# Patient Record
Sex: Male | Born: 1985 | Race: Black or African American | Hispanic: No | Marital: Single | Smoking: Former smoker
Health system: Southern US, Community
[De-identification: ages and names within clinical notes are randomized; demographics above are authoritative.]

## PROBLEM LIST (undated history)

## (undated) DIAGNOSIS — J45909 Unspecified asthma, uncomplicated: Secondary | ICD-10-CM

## (undated) DIAGNOSIS — E119 Type 2 diabetes mellitus without complications: Secondary | ICD-10-CM

## (undated) DIAGNOSIS — R339 Retention of urine, unspecified: Secondary | ICD-10-CM

## (undated) DIAGNOSIS — R310 Gross hematuria: Secondary | ICD-10-CM

## (undated) DIAGNOSIS — I209 Angina pectoris, unspecified: Secondary | ICD-10-CM

## (undated) DIAGNOSIS — J40 Bronchitis, not specified as acute or chronic: Secondary | ICD-10-CM

## (undated) DIAGNOSIS — F909 Attention-deficit hyperactivity disorder, unspecified type: Secondary | ICD-10-CM

## (undated) DIAGNOSIS — Z978 Presence of other specified devices: Secondary | ICD-10-CM

## (undated) DIAGNOSIS — I10 Essential (primary) hypertension: Secondary | ICD-10-CM

## (undated) DIAGNOSIS — Z960 Presence of urogenital implants: Secondary | ICD-10-CM

## (undated) HISTORY — PX: NO PAST SURGERIES: SHX2092

---
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
## 2012-03-03 ENCOUNTER — Encounter (HOSPITAL_BASED_OUTPATIENT_CLINIC_OR_DEPARTMENT_OTHER): Payer: Self-pay | Admitting: *Deleted

## 2012-03-03 ENCOUNTER — Emergency Department (HOSPITAL_BASED_OUTPATIENT_CLINIC_OR_DEPARTMENT_OTHER)
Admission: EM | Admit: 2012-03-03 | Discharge: 2012-03-04 | Disposition: A | Discharge status: Home / Self Care | Payer: Self-pay | Attending: Emergency Medicine | Admitting: Emergency Medicine

## 2012-03-03 DIAGNOSIS — F17200 Nicotine dependence, unspecified, uncomplicated: Secondary | ICD-10-CM | POA: Insufficient documentation

## 2012-03-03 DIAGNOSIS — R112 Nausea with vomiting, unspecified: Secondary | ICD-10-CM | POA: Insufficient documentation

## 2012-03-03 DIAGNOSIS — R197 Diarrhea, unspecified: Secondary | ICD-10-CM | POA: Insufficient documentation

## 2012-03-03 MED ORDER — ONDANSETRON 8 MG PO TBDP
ORAL_TABLET | ORAL | Status: AC
  Administered 2012-03-03: 8 mg via ORAL
  Filled 2012-03-03: qty 1

## 2012-03-03 MED ORDER — ONDANSETRON 8 MG PO TBDP
8.0000 mg | ORAL_TABLET | Freq: Once | ORAL | Status: AC
  Administered 2012-03-03: 8 mg via ORAL

## 2012-03-03 NOTE — ED Notes (Signed)
Pt c/o N/V/D, HA and body aches since this am.

## 2012-03-03 NOTE — ED Provider Notes (Signed)
History     CSN: [id number]  Arrival date & time 03/03/12  2011   First MD Initiated Contact with Patient 03/03/12 2301      Chief Complaint  Patient presents with  . Emesis    (Consider location/radiation/quality/duration/timing/severity/associated sxs/prior treatment) Patient is a 27 y.o. male presenting with vomiting and diarrhea. The history is provided by the patient. No language interpreter was used.  Emesis  This is a new problem. The current episode started 12 to 24 hours ago. The problem occurs 2 to 4 times per day. The problem has not changed since onset.The emesis has an appearance of stomach contents. There has been no fever. Associated symptoms include diarrhea. Pertinent negatives include no abdominal pain, no chills and no fever. Risk factors include ill contacts.  Diarrhea The primary symptoms include nausea, vomiting and diarrhea. Primary symptoms do not include fever or abdominal pain. The illness began today. The onset was sudden. The problem has not changed since onset. Nausea began today.  The vomiting began today.  The illness does not include chills or constipation. Associated medical issues do not include inflammatory bowel disease.    History reviewed. No pertinent past medical history.  History reviewed. No pertinent past surgical history.  No family history on file.  History  Substance Use Topics  . Smoking status: Current Every Day Smoker  . Smokeless tobacco: Not on file  . Alcohol Use: No      Review of Systems  Constitutional: Negative for fever and chills.  Gastrointestinal: Positive for nausea, vomiting and diarrhea. Negative for abdominal pain and constipation.  All other systems reviewed and are negative.    Allergies  Review of patient's allergies indicates no known allergies.  Home Medications  No current outpatient prescriptions on file.  BP 129/74  Pulse 88  Temp 99.6 F (37.6 C) (Oral)  Resp 18  Wt 205 lb (92.987 kg)   SpO2 98%  Physical Exam  Constitutional: He is oriented to person, place, and time. He appears well-developed and well-nourished. No distress.  HENT:  Head: Normocephalic and atraumatic.  Mouth/Throat: Oropharynx is clear and moist.  Eyes: Conjunctivae normal are normal. Pupils are equal, round, and reactive to light.  Neck: Normal range of motion. Neck supple.  Cardiovascular: Normal rate, regular rhythm and intact distal pulses.   Pulmonary/Chest: Effort normal and breath sounds normal. He has no wheezes. He has no rales.  Abdominal: Soft. Bowel sounds are normal. He exhibits no mass. There is no tenderness. There is no rebound and no guarding.  Musculoskeletal: Normal range of motion.  Neurological: He is alert and oriented to person, place, and time.  Skin: Skin is warm and dry.  Psychiatric: He has a normal mood and affect.    ED Course  Procedures (including critical care time)  Labs Reviewed - No data to display No results found.   No diagnosis found.    MDM  No further emesis or diarrhea.  Patient exam, vitals and skin turgor reassuring.  No indication for labs or imaging at this time.  Will prescribe zofran        Tavone Caesar K Habib Kise-Rasch, MD 03/04/12 [phone]

## 2012-03-04 MED ORDER — ONDANSETRON 8 MG PO TBDP: ORAL_TABLET | ORAL | Status: DC

## 2014-11-06 ENCOUNTER — Encounter (HOSPITAL_BASED_OUTPATIENT_CLINIC_OR_DEPARTMENT_OTHER): Payer: Self-pay | Admitting: Emergency Medicine

## 2014-11-06 ENCOUNTER — Emergency Department (HOSPITAL_BASED_OUTPATIENT_CLINIC_OR_DEPARTMENT_OTHER)
Admission: EM | Admit: 2014-11-06 | Discharge: 2014-11-06 | Disposition: A | Discharge status: Home / Self Care | Payer: BLUE CROSS/BLUE SHIELD | Attending: Emergency Medicine | Admitting: Emergency Medicine

## 2014-11-06 ENCOUNTER — Emergency Department (HOSPITAL_BASED_OUTPATIENT_CLINIC_OR_DEPARTMENT_OTHER): Payer: BLUE CROSS/BLUE SHIELD

## 2014-11-06 DIAGNOSIS — J209 Acute bronchitis, unspecified: Secondary | ICD-10-CM | POA: Insufficient documentation

## 2014-11-06 DIAGNOSIS — Z720 Tobacco use: Secondary | ICD-10-CM | POA: Diagnosis not present

## 2014-11-06 DIAGNOSIS — R05 Cough: Secondary | ICD-10-CM | POA: Diagnosis present

## 2014-11-06 MED ORDER — ALBUTEROL SULFATE HFA 108 (90 BASE) MCG/ACT IN AERS
2.0000 | INHALATION_SPRAY | Freq: Once | RESPIRATORY_TRACT | Status: AC
  Administered 2014-11-06: 2 via RESPIRATORY_TRACT
  Filled 2014-11-06: qty 6.7

## 2014-11-06 MED ORDER — AZITHROMYCIN 250 MG PO TABS: ORAL_TABLET | ORAL | Status: DC

## 2014-11-06 MED ORDER — HYDROCOD POLST-CPM POLST ER 10-8 MG/5ML PO SUER
5.0000 mL | Freq: Once | ORAL | Status: AC
  Administered 2014-11-06: 5 mL via ORAL
  Filled 2014-11-06: qty 5

## 2014-11-06 MED ORDER — DEXAMETHASONE 4 MG PO TABS
10.0000 mg | ORAL_TABLET | Freq: Once | ORAL | Status: AC
  Administered 2014-11-06: 10 mg via ORAL

## 2014-11-06 MED ORDER — DEXAMETHASONE 4 MG PO TABS
ORAL_TABLET | ORAL | Status: AC
  Filled 2014-11-06: qty 3

## 2014-11-06 MED ORDER — HYDROCOD POLST-CPM POLST ER 10-8 MG/5ML PO SUER: 5.0000 mL | Freq: Two times a day (BID) | ORAL | Status: DC | PRN

## 2014-11-06 NOTE — ED Provider Notes (Signed)
CSN: [id number]     Arrival date & time 11/06/14  2031 History  This chart was scribed for Paula Libra, MD by Octavia Heir, ED Scribe. This patient was seen in room MH12/MH12 and the patient's care was started at 11:31 PM.    Chief Complaint  Patient presents with  . Cough      The history is provided by the patient. No language interpreter was used.   HPI Comments: Caleb Sharp is a 29 y.o. male who presents to the Emergency Department complaining of a persistent, worsening cough onset 6 days ago. Pt has been having associated wheezing, chest congestion and a "squeezing" sensation in his chest when he coughs. His cough has been severe enough today to cause posttussive emesis. Pt received a nebulizer treatment when he came to the ED to help alleviate cough with no significant relief in the cough. Pt denies diarrhea and fever.  History reviewed. No pertinent past medical history. History reviewed. No pertinent past surgical history. History reviewed. No pertinent family history. Social History  Substance Use Topics  . Smoking status: Current Every Day Smoker  . Smokeless tobacco: None  . Alcohol Use: No    Review of Systems  A complete 10 system review of systems was obtained and all systems are negative except as noted in the HPI and PMH.    Allergies  Review of patient's allergies indicates no known allergies.  Home Medications   Prior to Admission medications   Medication Sig Start Date End Date Taking? Authorizing Provider  azithromycin (ZITHROMAX Z-PAK) 250 MG tablet 2 po day one, then 1 daily x 4 days 11/06/14   Paula Libra, MD  chlorpheniramine-HYDROcodone [facility] ER) 10-8 MG/5ML SUER Take 5 mLs by mouth every 12 (twelve) hours as needed for cough. 11/06/14   Paula Libra, MD  ondansetron (ZOFRAN ODT) 8 MG disintegrating tablet  ODT q8 hours prn nausea 03/04/12   April Palumbo, MD   Triage vitals: BP 126/88 mmHg  Pulse 111  Temp(Src) 98.9 F (37.2 C)  (Oral)  Resp 22  Ht  (1.753 m)  Wt 215 lb (97.523 kg)  BMI 31.74 kg/m2  SpO2 100%  Physical Exam  General: Well-developed, well-nourished male in no acute distress; appearance consistent with age of record HENT: normocephalic; atraumatic; pharynx normal; no dysphonia Eyes: pupils equal, round and reactive to light; extraocular muscles intact Neck: supple Heart: regular rate and rhythm Lungs: clear to auscultation bilaterally; frequent cough Abdomen: soft; nondistended; nontender; bowel sounds present Extremities: No deformity; full range of motion Neurologic: Awake, alert and oriented; motor function intact in all extremities and symmetric; no facial droop Skin: Warm and dry Psychiatric: Normal mood and affect   ED Course  Procedures  DIAGNOSTIC STUDIES: Oxygen Saturation is 100% on RA, normal by my interpretation.  COORDINATION OF CARE:  11:36 PM Discussed treatment plan with pt at bedside and pt agreed to plan.   EKG Interpretation   Date/Time:  Tuesday November 06 2014 20:51:41 EDT Ventricular Rate:  114 PR Interval:  152 QRS Duration: 100 QT Interval:  332 QTC Calculation: 457 R Axis:   137 Text Interpretation:  Sinus tachycardia Right axis deviation ST \\T \ T wave  abnormality, consider inferior ischemia Abnormal ECG Nonspecific ST  abnormality Confirmed by Manus Gunning  MD, STEPHEN [phone]) on 11/06/2014 8:53:30  PM      MDM  Nursing notes and vitals signs, including pulse oximetry, reviewed.  Summary of this visit's results, reviewed by myself:  Imaging  Studies: Dg Chest 2 View  11/06/2014   CLINICAL DATA:  Cough, mid chest pain.  EXAM: CHEST  2 VIEW  COMPARISON:  None.  FINDINGS: The heart size and mediastinal contours are within normal limits. Both lungs are clear. The visualized skeletal structures are unremarkable.  IMPRESSION: No active cardiopulmonary disease.   Electronically Signed   By: Esperanza Heir M.D.   On: 11/06/2014 23:22     Final  diagnoses:  Acute bronchitis with bronchospasm   I personally performed the services described in this documentation, which was scribed in my presence. The recorded information has been reviewed and is accurate.   Paula Libra, MD 11/06/14 [phone]

## 2014-11-06 NOTE — Discharge Instructions (Signed)

## 2014-11-06 NOTE — ED Notes (Signed)
Patient states that he is cough with chest burning. Reports that he is also vomiting after he coughs

## 2014-11-06 NOTE — ED Notes (Signed)
Pt returned from xray

## 2014-11-06 NOTE — ED Notes (Signed)
Pt c/o cough with yellow phlegm since Thursday.  Pt denies fever, hx of asthma and bronchitis.

## 2014-11-06 NOTE — ED Notes (Signed)
Patient transported to X-ray 

## 2014-11-06 NOTE — ED Notes (Signed)
MD at bedside. 

## 2015-06-08 ENCOUNTER — Emergency Department (HOSPITAL_BASED_OUTPATIENT_CLINIC_OR_DEPARTMENT_OTHER)
Admission: EM | Admit: 2015-06-08 | Discharge: 2015-06-08 | Disposition: A | Discharge status: Home / Self Care | Payer: BLUE CROSS/BLUE SHIELD | Attending: Emergency Medicine | Admitting: Emergency Medicine

## 2015-06-08 ENCOUNTER — Encounter (HOSPITAL_BASED_OUTPATIENT_CLINIC_OR_DEPARTMENT_OTHER): Payer: Self-pay | Admitting: Emergency Medicine

## 2015-06-08 DIAGNOSIS — Z7984 Long term (current) use of oral hypoglycemic drugs: Secondary | ICD-10-CM | POA: Diagnosis not present

## 2015-06-08 DIAGNOSIS — E119 Type 2 diabetes mellitus without complications: Secondary | ICD-10-CM | POA: Diagnosis not present

## 2015-06-08 DIAGNOSIS — Z87891 Personal history of nicotine dependence: Secondary | ICD-10-CM | POA: Diagnosis not present

## 2015-06-08 DIAGNOSIS — R339 Retention of urine, unspecified: Secondary | ICD-10-CM | POA: Diagnosis not present

## 2015-06-08 DIAGNOSIS — R300 Dysuria: Secondary | ICD-10-CM | POA: Diagnosis present

## 2015-06-08 LAB — URINE MICROSCOPIC-ADD ON

## 2015-06-08 LAB — URINALYSIS, ROUTINE W REFLEX MICROSCOPIC
Bilirubin Urine: NEGATIVE
Glucose, UA: >1000 mg/dL — AB
Ketones, ur: NEGATIVE mg/dL
Leukocytes, UA: NEGATIVE
Nitrite: NEGATIVE
Protein, ur: 30 mg/dL — AB
Specific Gravity, Urine: 1.026 (ref 1.005–1.030)
pH: 5.5 (ref 5.0–8.0)

## 2015-06-08 MED ORDER — TAMSULOSIN HCL 0.4 MG PO CAPS: 0.4000 mg | ORAL_CAPSULE | Freq: Every day | ORAL | Status: DC

## 2015-06-08 NOTE — Discharge Instructions (Signed)
Acute Urinary Retention, Male °Acute urinary retention is the temporary inability to urinate. °This is a common problem in older men. As men age their prostates become larger and block the flow of urine from the bladder. This is usually a problem that has come on gradually.  °HOME CARE INSTRUCTIONS °If you are sent home with a Foley catheter and a drainage system, you will need to discuss the best course of action with your health care provider. While the catheter is in, maintain a good intake of fluids. Keep the drainage bag emptied and lower than your catheter. This is so that contaminated urine will not flow back into your bladder, which could lead to a urinary tract infection. °There are two main types of drainage bags. One is a large bag that usually is used at night. It has a good capacity that will allow you to sleep through the night without having to empty it. The second type is called a leg bag. It has a smaller capacity, so it needs to be emptied more frequently. However, the main advantage is that it can be attached by a leg strap and can go underneath your clothing, allowing you the freedom to move about or leave your home. °Only take over-the-counter or prescription medicines for pain, discomfort, or fever as directed by your health care provider.  °SEEK MEDICAL CARE IF: °· You develop a low-grade fever. °· You experience spasms or leakage of urine with the spasms. °SEEK IMMEDIATE MEDICAL CARE IF:  °· You develop chills or fever. °· Your catheter stops draining urine. °· Your catheter falls out. °· You start to develop increased bleeding that does not respond to rest and increased fluid intake. °MAKE SURE YOU: °· Understand these instructions. °· Will watch your condition. °· Will get help right away if you are not doing well or get worse. °  °This information is not intended to replace advice given to you by your health care provider. Make sure you discuss any questions you have with your health care  provider. °  °Document Released: 05/25/2000 Document Revised: 07/03/2014 Document Reviewed: 07/28/2012 °Elsevier Interactive Patient Education ©2016 [employer] ° °

## 2015-06-08 NOTE — ED Notes (Signed)
Gave and Applied leg bag to patient's left leg.

## 2015-06-08 NOTE — ED Provider Notes (Addendum)
CSN: [id number]     Arrival date & time 06/08/15  6962 History   First MD Initiated Contact with Patient 06/08/15 0654     Chief Complaint  Patient presents with  . Dysuria     (Consider location/radiation/quality/duration/timing/severity/associated sxs/prior Treatment) HPI Comments: Patient history of diabetes presents with inability to urinate. He states he was able to urinate small amounts yesterday and his last normal urine was about 6:00 last night. He hasn't had any good urine output at all through the night. He has some pressure to his lower abdomen and is very uncomfortable. He denies any nausea or vomiting. No back pain. No burning on urination. He states in the past he's had difficulty urinating from time to time but generally self resolved over 24 hours. He also has had past urinary tract infections. He has not ever seen a urologist. He denies any constipation or pain with bowel movements. He denies any penile pain or discharge. He denies any known fevers.  Patient is a 30 y.o. male presenting with dysuria.  Dysuria Pertinent negatives include no chest pain, no abdominal pain, no headaches and no shortness of breath.    Past Medical History  Diagnosis Date  . Diabetes mellitus without complication (HCC)    History reviewed. No pertinent past surgical history. No family history on file. Social History  Substance Use Topics  . Smoking status: Former Smoker    Quit date: 12/31/2014  . Smokeless tobacco: None  . Alcohol Use: No    Review of Systems  Constitutional: Negative for fever, chills, diaphoresis and fatigue.  HENT: Negative for congestion, rhinorrhea and sneezing.   Eyes: Negative.   Respiratory: Negative for cough, chest tightness and shortness of breath.   Cardiovascular: Negative for chest pain and leg swelling.  Gastrointestinal: Negative for nausea, vomiting, abdominal pain, diarrhea and blood in stool.  Genitourinary: Positive for dysuria. Negative for  frequency, hematuria, flank pain and difficulty urinating.  Musculoskeletal: Negative for back pain and arthralgias.  Skin: Negative for rash.  Neurological: Negative for dizziness, speech difficulty, weakness, numbness and headaches.      Allergies  Review of patient's allergies indicates no known allergies.  Home Medications   Prior to Admission medications   Medication Sig Start Date End Date Taking? Authorizing Provider  metFORMIN (GLUCOPHAGE) 1000 MG tablet Take 1,000 mg by mouth 2 (two) times daily with a meal.   Yes Historical Provider, MD  azithromycin (ZITHROMAX Z-PAK) 250 MG tablet 2 po day one, then 1 daily x 4 days 11/06/14   Paula Libra, MD  chlorpheniramine-HYDROcodone [facility] PENNKINETIC ER) 10-8 MG/5ML SUER Take 5 mLs by mouth every 12 (twelve) hours as needed for cough. 11/06/14   John Molpus, MD  ondansetron (ZOFRAN ODT) 8 MG disintegrating tablet  ODT q8 hours prn nausea 03/04/12   April Palumbo, MD   BP 144/99 mmHg  Pulse 98  Temp(Src) 98.5 F (36.9 C) (Oral)  Resp 18  SpO2 99% Physical Exam  Constitutional: He is oriented to person, place, and time. He appears well-developed and well-nourished. He appears distressed (appears uncomfortable).  HENT:  Head: Normocephalic and atraumatic.  Eyes: Pupils are equal, round, and reactive to light.  Neck: Normal range of motion. Neck supple.  Cardiovascular: Normal rate, regular rhythm and normal heart sounds.   Pulmonary/Chest: Effort normal and breath sounds normal. No respiratory distress. He has no wheezes. He has no rales. He exhibits no tenderness.  Abdominal: Soft. Bowel sounds are normal. There is no tenderness. There  is no rebound and no guarding.  Genitourinary:  Normal circumcised penis. No testicular pain. No pain in the inguinal canals. No masses.  Musculoskeletal: Normal range of motion. He exhibits no edema.  Lymphadenopathy:    He has no cervical adenopathy.  Neurological: He is alert and oriented to  person, place, and time.  Skin: Skin is warm and dry. No rash noted.  Psychiatric: He has a normal mood and affect.    ED Course  Procedures (including critical care time) Labs Review Labs Reviewed  URINALYSIS, ROUTINE W REFLEX MICROSCOPIC (NOT [facility]) - Abnormal; Notable for the following:    Glucose, UA >1000 (*)    Hgb urine dipstick LARGE (*)    Protein, ur 30 (*)    All other components within normal limits  URINE MICROSCOPIC-ADD ON - Abnormal; Notable for the following:    Squamous Epithelial / LPF 0-5 (*)    Bacteria, UA FEW (*)    All other components within normal limits  URINE CULTURE    Imaging Review No results found. I have personally reviewed and evaluated these images and lab results as part of my medical decision-making.   EKG Interpretation None      MDM   Final diagnoses:  Urinary retention    Patient has had over 500 cc of yellow urine return after Foley catheter was placed in the ED. Given this, the catheter was left in place with a leg bag. There is no evidence of a UTI. He does have blood but the nurse to place the Foley states that it was a bit traumatic and feels like the blood is likely related to that. He doesn't have any symptoms that would be more consistent with renal colic. He hasn't had any recent pain to his back or abdomen that sounds suspicious for kidney stones. He was discharged home in good condition. He was advised that it's very important to make an APPOINTMENT with urology for follow-up.  I also started him on flomax    Rolan BuccoMelanie Jacobus Colvin, MD 06/08/15 [id number]  Rolan BuccoMelanie Tinika Bucknam, MD 06/08/15 [phone]

## 2015-06-08 NOTE — ED Notes (Signed)
Pt states he has been unable to void since yesterday. Pt visibly uncomfortable. States some low back pain, denies fever. States he is able to trickle and sees some blood but no substantial stream. States no hx of this.

## 2015-06-10 LAB — URINE CULTURE: Culture: NO GROWTH

## 2015-06-12 ENCOUNTER — Other Ambulatory Visit: Payer: Self-pay | Admitting: Urology

## 2015-06-17 ENCOUNTER — Encounter (HOSPITAL_BASED_OUTPATIENT_CLINIC_OR_DEPARTMENT_OTHER): Payer: Self-pay | Admitting: *Deleted

## 2015-06-17 NOTE — Progress Notes (Signed)
NPO AFTER MN.  ARRIVE AT 1030.  NEEDS EKG.  GETTING LAB WORK DONE Tuesday 06-18-2015.  WILL TAKE RAPAFLO AM DOS W/ SIPS OF WATER.

## 2015-06-18 NOTE — H&P (Cosign Needed)
History of Present Illness   Caleb Sharp presents today as a referral from the emergency room for urinary retention. He is currently 30 years of age and without prior urologic history. He tells me several months ago he was diagnosed with diabetes mellitus and currently is on metformin. For months now, he has had intermittent gross hematuria, total and terminal in nature. For several weeks, he began noticing increased hesitancy with a significant weakening of his urinary stream. This culminated in urinary retention about 5 days ago. A urethral catheter was placed with 600 mL of clear urine obtained. They did not indicate any significant difficulty inserting the catheter. Urinalysis did show blood along with glucosuria. There was no evidence of cystitis or prostatitis. He had had no clinical symptoms suggestive of either of those conditions. He has had typical irritative symptoms from the catheter but otherwise has been doing okay. He has noticed some ongoing intermittent hematuria. Urine in the bag today looks relatively unremarkable. He denies any prior history of urethral instrumentation or obvious known traumas or infection. He was given a prescription for Flomax but has not picked up that medication at this time.     Past Medical History Problems  1. History of diabetes mellitus (Z86.39)  Surgical History Problems  1. History of No Surgical Problems  Current Meds 1. Flomax 0.4 MG Oral Capsule;  Therapy: (Recorded:12Apr2017) to Recorded 2. MetFORMIN HCl - 1000 MG Oral Tablet;  Therapy: (Recorded:12Apr2017) to Recorded  Allergies Medication  1. No Known Drug Allergies  Family History Problems  1. No pertinent family history : Mother, Father  Social History Problems    Alcohol use (Z78.9)   social use   Caffeine use (F15.90)   2 sodas daily   Former smoker [phone](Z87.891)   smoked for 4 years; quit approx. 1.5 years ago   Number of children   1 daughter   Occupation   [employer]  Review of Systems Genitourinary, constitutional, skin, eye, otolaryngeal, hematologic/lymphatic, cardiovascular, pulmonary, endocrine, musculoskeletal, gastrointestinal, neurological and psychiatric system(s) were reviewed and pertinent findings if present are noted and are otherwise negative.  Genitourinary: difficulty starting the urinary stream, weak urinary stream, urinary stream starts and stops, incomplete emptying of bladder and hematuria.  Gastrointestinal: no flank pain and no abdominal pain.  ENT: sore throat.    Vitals Vital Signs [Data Includes: Last 1 Day]  Recorded: 12Apr2017 10:31AM  Height: 5 ft 6 in Weight: 219 lb  BMI Calculated: 35.35 BSA Calculated: 2.08 Blood Pressure: 134 / 92 Temperature: 98.3 F Heart Rate: 104  Physical Exam Constitutional: Well nourished and well developed . No acute distress.  ENT:. The ears and nose are normal in appearance.  Neck: The appearance of the neck is normal and no neck mass is present.  Pulmonary: No respiratory distress and normal respiratory rhythm and effort.  Abdomen: The abdomen is soft and nontender. No masses are palpated. No CVA tenderness. No hernias are palpable. No hepatosplenomegaly noted.  Rectal: The prostate exam was deferred.  Genitourinary: Examination of the penis demonstrates an indwelling catheter, but no discharge, no masses, no lesions and a normal meatus. The penis is circumcised. The scrotum is without lesions. The right epididymis is palpably normal and non-tender. The left epididymis is palpably normal and non-tender. The right testis is non-tender and without masses. The left testis is non-tender and without masses.  Skin: Normal skin turgor, no visible rash and no visible skin lesions.  Neuro/Psych:. Mood and affect are appropriate.  Assessment Assessed  1. Hematuria, gross (R31.0) 2. Retention, urine (R33.9) 3. 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 AU  CT-HEMATURIA PROTOCOL; Status:Hold For - Appointment,PreCert,Date of  Service,Print; Requested for:12Apr2017;  2. Follow-up Schedule Surgery Office  Follow-up  Status: Hold For - Appointment   Requested for: 12Apr2017  Discussion/Summary   Caleb Sharp has had several months now of intermittent gross hematuria. More recently, he developed significant obstructive voiding symptoms, which progressed to the point that he developed urinary retention. He definitely requires additional assessment. I would not feel comfortable simply removing his catheter, especially given the fact he has not started on any alpha blocker therapy. Urethral stricture or dysfunctional voiding due to tightening of the bladder neck are certainly possibilities. We do need to rule out other less likely and more unusual etiologies. I am not sure how well he would tolerate an in-office cystoscopy, so I have suggested we do that under anesthesia. I would like him to try an alpha blocker, and we will get him on RAPAFLO with samples provided. We will order a hematuria protocol CT to be performed sometime in the next several days. We will plan on, hopefully next week, bringing him in for a cystoscopy with assessment. If there is evidence of any urethral stricture, we can dilate, and if anything needs to be biopsied or further evaluated, we will do that. We would plan on an outpatient procedure with removal of his catheter, and hopefully he will be able to void.  cc: Jackie Plum, MD   Signatures Electronically signed by : Barron Alvine, M.D.; Jun 14 2015  8:57AM EST

## 2015-06-19 ENCOUNTER — Encounter (HOSPITAL_BASED_OUTPATIENT_CLINIC_OR_DEPARTMENT_OTHER)
Admission: RE | Disposition: A | Discharge status: Home / Self Care | Payer: Self-pay | Source: Ambulatory Visit | Attending: Urology

## 2015-06-19 ENCOUNTER — Encounter (HOSPITAL_BASED_OUTPATIENT_CLINIC_OR_DEPARTMENT_OTHER): Payer: Self-pay | Admitting: Anesthesiology

## 2015-06-19 ENCOUNTER — Ambulatory Visit (HOSPITAL_BASED_OUTPATIENT_CLINIC_OR_DEPARTMENT_OTHER): Payer: BLUE CROSS/BLUE SHIELD | Admitting: Anesthesiology

## 2015-06-19 ENCOUNTER — Ambulatory Visit (HOSPITAL_BASED_OUTPATIENT_CLINIC_OR_DEPARTMENT_OTHER)
Admission: RE | Admit: 2015-06-19 | Discharge: 2015-06-19 | Disposition: A | Discharge status: Home / Self Care | Payer: BLUE CROSS/BLUE SHIELD | Source: Ambulatory Visit | Attending: Urology | Admitting: Urology

## 2015-06-19 DIAGNOSIS — E119 Type 2 diabetes mellitus without complications: Secondary | ICD-10-CM | POA: Insufficient documentation

## 2015-06-19 DIAGNOSIS — R310 Gross hematuria: Secondary | ICD-10-CM | POA: Diagnosis not present

## 2015-06-19 DIAGNOSIS — R339 Retention of urine, unspecified: Secondary | ICD-10-CM | POA: Diagnosis present

## 2015-06-19 DIAGNOSIS — Z87891 Personal history of nicotine dependence: Secondary | ICD-10-CM | POA: Diagnosis not present

## 2015-06-19 DIAGNOSIS — Z9114 Patient's other noncompliance with medication regimen: Secondary | ICD-10-CM | POA: Insufficient documentation

## 2015-06-19 DIAGNOSIS — R000 Tachycardia, unspecified: Secondary | ICD-10-CM | POA: Diagnosis not present

## 2015-06-19 HISTORY — DX: Retention of urine, unspecified: R33.9

## 2015-06-19 HISTORY — DX: Presence of other specified devices: Z97.8

## 2015-06-19 HISTORY — PX: CYSTOSCOPY WITH FULGERATION: SHX6638

## 2015-06-19 HISTORY — DX: Presence of urogenital implants: Z96.0

## 2015-06-19 HISTORY — DX: Type 2 diabetes mellitus without complications: E11.9

## 2015-06-19 HISTORY — DX: Gross hematuria: R31.0

## 2015-06-19 LAB — CBC
HCT: 46.9 % (ref 39.0–52.0)
Hemoglobin: 16.6 g/dL (ref 13.0–17.0)
MCH: 30.5 pg (ref 26.0–34.0)
MCHC: 35.4 g/dL (ref 30.0–36.0)
MCV: 86.1 fL (ref 78.0–100.0)
Platelets: 265 10*3/uL (ref 150–400)
RBC: 5.45 MIL/uL (ref 4.22–5.81)
RDW: 12.5 % (ref 11.5–15.5)
WBC: 7.4 10*3/uL (ref 4.0–10.5)

## 2015-06-19 LAB — BASIC METABOLIC PANEL
Anion gap: 10 (ref 5–15)
BUN: 13 mg/dL (ref 6–20)
CO2: 27 mmol/L (ref 22–32)
Calcium: 9.8 mg/dL (ref 8.9–10.3)
Chloride: 101 mmol/L (ref 101–111)
Creatinine, Ser: 0.89 mg/dL (ref 0.61–1.24)
GFR calc Af Amer: >60 mL/min (ref >60)
GFR calc non Af Amer: >60 mL/min (ref >60)
Glucose, Bld: 261 mg/dL — ABNORMAL HIGH (ref 65–99)
Potassium: 4.1 mmol/L (ref 3.5–5.1)
Sodium: 138 mmol/L (ref 135–145)

## 2015-06-19 LAB — GLUCOSE, CAPILLARY
Glucose-Capillary: 235 mg/dL — ABNORMAL HIGH (ref 65–99)
Glucose-Capillary: 231 mg/dL — ABNORMAL HIGH (ref 65–99)

## 2015-06-19 SURGERY — CYSTOSCOPY, WITH BLADDER FULGURATION
Anesthesia: General | Site: Bladder

## 2015-06-19 MED ORDER — CEFAZOLIN SODIUM-DEXTROSE 2-4 GM/100ML-% IV SOLN
2.0000 g | INTRAVENOUS | Status: AC
  Administered 2015-06-19: 2 g via INTRAVENOUS
  Filled 2015-06-19: qty 100

## 2015-06-19 MED ORDER — MIDAZOLAM HCL 2 MG/2ML IJ SOLN
INTRAMUSCULAR | Status: AC
  Filled 2015-06-19: qty 2

## 2015-06-19 MED ORDER — ONDANSETRON HCL 4 MG/2ML IJ SOLN
INTRAMUSCULAR | Status: DC | PRN
  Administered 2015-06-19: 4 mg via INTRAVENOUS

## 2015-06-19 MED ORDER — FENTANYL CITRATE (PF) 100 MCG/2ML IJ SOLN
INTRAMUSCULAR | Status: AC
  Filled 2015-06-19: qty 2

## 2015-06-19 MED ORDER — LACTATED RINGERS IV SOLN
INTRAVENOUS | Status: DC
  Administered 2015-06-19 (×2): via INTRAVENOUS
  Filled 2015-06-19: qty 1000

## 2015-06-19 MED ORDER — LIDOCAINE HCL (CARDIAC) 20 MG/ML IV SOLN
INTRAVENOUS | Status: AC
  Filled 2015-06-19: qty 5

## 2015-06-19 MED ORDER — CEFAZOLIN SODIUM-DEXTROSE 2-4 GM/100ML-% IV SOLN
INTRAVENOUS | Status: AC
  Filled 2015-06-19: qty 100

## 2015-06-19 MED ORDER — LIDOCAINE HCL 2 % EX GEL
CUTANEOUS | Status: DC | PRN
  Administered 2015-06-19: 1 via URETHRAL

## 2015-06-19 MED ORDER — FENTANYL CITRATE (PF) 100 MCG/2ML IJ SOLN
INTRAMUSCULAR | Status: DC | PRN
  Administered 2015-06-19 (×3): 25 ug via INTRAVENOUS
  Administered 2015-06-19: 50 ug via INTRAVENOUS

## 2015-06-19 MED ORDER — KETOROLAC TROMETHAMINE 30 MG/ML IJ SOLN
INTRAMUSCULAR | Status: DC | PRN
  Administered 2015-06-19: 30 mg via INTRAVENOUS

## 2015-06-19 MED ORDER — HYDROMORPHONE HCL 1 MG/ML IJ SOLN
0.2500 mg | INTRAMUSCULAR | Status: DC | PRN
  Filled 2015-06-19: qty 1

## 2015-06-19 MED ORDER — PROPOFOL 10 MG/ML IV BOLUS
INTRAVENOUS | Status: AC
Start: 2015-06-19 — End: 2015-06-19
  Filled 2015-06-19: qty 40

## 2015-06-19 MED ORDER — DEXAMETHASONE SODIUM PHOSPHATE 4 MG/ML IJ SOLN
INTRAMUSCULAR | Status: DC | PRN
  Administered 2015-06-19: 5 mg via INTRAVENOUS

## 2015-06-19 MED ORDER — PHENAZOPYRIDINE HCL 200 MG PO TABS: 200.0000 mg | ORAL_TABLET | Freq: Three times a day (TID) | ORAL | Status: DC | PRN

## 2015-06-19 MED ORDER — PROPOFOL 10 MG/ML IV BOLUS
INTRAVENOUS | Status: DC | PRN
  Administered 2015-06-19: 100 mg via INTRAVENOUS
  Administered 2015-06-19: 200 mg via INTRAVENOUS
  Administered 2015-06-19: 50 mg via INTRAVENOUS

## 2015-06-19 MED ORDER — PROMETHAZINE HCL 25 MG/ML IJ SOLN
6.2500 mg | INTRAMUSCULAR | Status: DC | PRN
  Filled 2015-06-19: qty 1

## 2015-06-19 MED ORDER — STERILE WATER FOR IRRIGATION IR SOLN
Status: DC | PRN
  Administered 2015-06-19: 3000 mL

## 2015-06-19 MED ORDER — PHENAZOPYRIDINE HCL 200 MG PO TABS
200.0000 mg | ORAL_TABLET | Freq: Once | ORAL | Status: AC
  Administered 2015-06-19: 200 mg via ORAL
  Filled 2015-06-19: qty 1

## 2015-06-19 MED ORDER — KETOROLAC TROMETHAMINE 30 MG/ML IJ SOLN
INTRAMUSCULAR | Status: AC
  Filled 2015-06-19: qty 1

## 2015-06-19 MED ORDER — PHENAZOPYRIDINE HCL 100 MG PO TABS
ORAL_TABLET | ORAL | Status: AC
  Filled 2015-06-19: qty 2

## 2015-06-19 MED ORDER — LIDOCAINE HCL (CARDIAC) 20 MG/ML IV SOLN
INTRAVENOUS | Status: DC | PRN
  Administered 2015-06-19: 100 mg via INTRAVENOUS

## 2015-06-19 MED ORDER — MIDAZOLAM HCL 5 MG/5ML IJ SOLN
INTRAMUSCULAR | Status: DC | PRN
  Administered 2015-06-19: 2 mg via INTRAVENOUS

## 2015-06-19 SURGICAL SUPPLY — 37 items
BAG DRAIN URO-CYSTO SKYTR STRL (DRAIN) ×3 IMPLANT
BAG URINE DRAINAGE (UROLOGICAL SUPPLIES) IMPLANT
BAG URINE LEG 500ML (DRAIN) IMPLANT
BALLN NEPHROSTOMY (BALLOONS)
BALLOON NEPHROSTOMY (BALLOONS) IMPLANT
CANISTER SUCT LVC 12 LTR MEDI- (MISCELLANEOUS) IMPLANT
CATH FOLEY 2WAY SLVR  5CC 16FR (CATHETERS)
CATH FOLEY 2WAY SLVR  5CC 18FR (CATHETERS)
CATH FOLEY 2WAY SLVR  5CC 20FR (CATHETERS)
CATH FOLEY 2WAY SLVR 5CC 16FR (CATHETERS) IMPLANT
CATH FOLEY 2WAY SLVR 5CC 18FR (CATHETERS) IMPLANT
CATH FOLEY 2WAY SLVR 5CC 20FR (CATHETERS) IMPLANT
CATH ROBINSON RED A/P 14FR (CATHETERS) IMPLANT
CATH ROBINSON RED A/P 16FR (CATHETERS) IMPLANT
CLOTH BEACON ORANGE TIMEOUT ST (SAFETY) ×3 IMPLANT
ELECT REM PT RETURN 9FT ADLT (ELECTROSURGICAL) ×3
ELECTRODE REM PT RTRN 9FT ADLT (ELECTROSURGICAL) ×2 IMPLANT
GLOVE BIO SURGEON STRL SZ7.5 (GLOVE) ×3 IMPLANT
GLOVE SURG SS PI 7.5 STRL IVOR (GLOVE) ×6 IMPLANT
GOWN STRL REUS W/ TWL XL LVL3 (GOWN DISPOSABLE) ×2 IMPLANT
GOWN STRL REUS W/TWL LRG LVL3 (GOWN DISPOSABLE) ×3 IMPLANT
GOWN STRL REUS W/TWL XL LVL3 (GOWN DISPOSABLE) ×1
GUIDEWIRE STR DUAL SENSOR (WIRE) IMPLANT
HOLDER FOLEY CATH W/STRAP (MISCELLANEOUS) IMPLANT
KIT ROOM TURNOVER WOR (KITS) ×3 IMPLANT
MANIFOLD NEPTUNE II (INSTRUMENTS) IMPLANT
NDL SAFETY ECLIPSE 18X1.5 (NEEDLE) IMPLANT
NEEDLE HYPO 18GX1.5 SHARP (NEEDLE)
NEEDLE HYPO 22GX1.5 SAFETY (NEEDLE) IMPLANT
NS IRRIG 500ML POUR BTL (IV SOLUTION) IMPLANT
PACK CYSTO (CUSTOM PROCEDURE TRAY) ×3 IMPLANT
SYR 20CC LL (SYRINGE) IMPLANT
SYR 30ML LL (SYRINGE) IMPLANT
SYR BULB IRRIGATION 50ML (SYRINGE) IMPLANT
SYRINGE 10CC LL (SYRINGE) ×3 IMPLANT
TUBE CONNECTING 12X1/4 (SUCTIONS) IMPLANT
WATER STERILE IRR 3000ML UROMA (IV SOLUTION) ×3 IMPLANT

## 2015-06-19 NOTE — Discharge Instructions (Addendum)

## 2015-06-19 NOTE — Anesthesia Procedure Notes (Signed)
Procedure Name: LMA Insertion Date/Time: 06/19/2015 12:07 PM Performed by: Norva PavlovALLAWAY, Suriah Peragine G Pre-anesthesia Checklist: Patient identified, Emergency Drugs available, Suction available and Patient being monitored Patient Re-evaluated:Patient Re-evaluated prior to inductionOxygen Delivery Method: Circle System Utilized Preoxygenation: Pre-oxygenation with 100% oxygen Intubation Type: IV induction Ventilation: Mask ventilation without difficulty LMA: LMA inserted LMA Size: 5.0 Number of attempts: 1 Airway Equipment and Method: bite block Placement Confirmation: positive ETCO2 Tube secured with: Tape Dental Injury: Teeth and Oropharynx as per pre-operative assessment

## 2015-06-19 NOTE — Transfer of Care (Signed)
Last Vitals:  Filed Vitals:   06/19/15 0959  BP: 145/104  Pulse: 110  Temp: 37.2 C  Resp: 16    Immediate Anesthesia Transfer of Care Note  Patient: Caleb Sharp  Procedure(s) Performed: Procedure(s) (LRB): CYSTOSCOPY WITH FULGERATION (N/A)  Patient Location: PACU  Anesthesia Type: General  Level of Consciousness: awake, alert  and oriented  Airway & Oxygen Therapy: Patient Spontanous Breathing and Patient connected to face mask oxygen  Post-op Assessment: Report given to PACU RN and Post -op Vital signs reviewed and stable  Post vital signs: Reviewed and stable  Complications: No apparent anesthesia complications

## 2015-06-19 NOTE — Op Note (Signed)
Preoperative diagnosis: Urinary retention and gross hematuria Postoperative diagnosis: Same  Procedure: Cystoscopy with fulguration of bladder neck   Surgeon: Valetta Fulleravid S. Sandria Mcenroe M.D.  Anesthesia: Gen.  Indications: Caleb Sharp is 30 years of age. He recently developed several weeks of increased urinary hesitancy which ultimately culminated in urinary retention. He presented emergency room where a catheter was inserted and a large amount of urine was obtained. There were no obvious explanations for his hematuria based on history and exam. He denied any over-the-counter medication or drug use. He had had no clinical signs and symptoms of prostatitis and no evidence of UTI or prostatitis on his presentation to the emergency room. He did however admit to 6 months of intermittent gross hematuria. On questioning this appeared to be primarily terminal in nature suggesting etiology at the bladder neck. He had not had any prior instrumentation and is denied any known history of STDs. He presented to our office with an indwelling Foley catheter. He had been given a prescription for alpha-blocker but had not started that medication. Because of his hematuria we went ahead and recommended a CT of his abdomen and pelvis by hematuria protocol. The did not show any obvious pathology. Present now for cystoscopic assessment. He currently has an indwelling urethral catheter.     Technique and findings: Patient was brought the operating room where he had successful induction of general anesthesia. He was placed in lithotomy position. Foley catheter was removed. He was prepped and draped in usual manner. Appropriate surgical timeout was performed. He received 2 g of Ancef perioperatively. Cystoscopically he had a unremarkable anterior urethra. I could not appreciate any definitive evidence of prior urethral stricture and there was no evidence of narrowing. No evidence of urethral polyp or foreign body. The prostatic urethra was  relatively short measuring 3 cm. He did have a somewhat prominent and high riding median bar. There was some active oozing and irritation at the bladder neck posteriorly. This is likely to been secondary to his indwelling catheter. The bladder showed some slight inflammatory change consistent with an indwelling catheter. No other pathology was appreciated. Of note however bladder capacity was significantly reduced at 300 mL under anesthesia.   We did use a Bugbee electrode to fulgurate some active using at the bladder neck. There appeared to be some calcifications right there is embedded within the mucosa all very small. At a millimeter or less in size. The bladder was then drained. Lidocaine jelly was instilled per urethra and the patient was brought back to PACU in stable condition.

## 2015-06-19 NOTE — Anesthesia Preprocedure Evaluation (Addendum)
Anesthesia Evaluation  Patient identified by MRN, date of birth, ID band Patient awake  General Assessment Comment:Clearly noncompliant on all meds , previously on BPmeds and diabetic meds simply has stopped them.  Reviewed: Allergy & Precautions, NPO status , Patient's Chart, lab work & pertinent test results  History of Anesthesia Complications Negative for: history of anesthetic complications  Airway Mallampati: I  TM Distance: >3 FB Neck ROM: Full    Dental  (+) Teeth Intact   Pulmonary neg pulmonary ROS, former smoker,    breath sounds clear to auscultation       Cardiovascular negative cardio ROS   Rhythm:Regular Rate:Tachycardia  EKG sinus tach   Neuro/Psych negative neurological ROS     GI/Hepatic negative GI ROS, Neg liver ROS,   Endo/Other  diabetes  Renal/GU      Musculoskeletal   Abdominal   Peds  Hematology   Anesthesia Other Findings   Reproductive/Obstetrics                            Anesthesia Physical Anesthesia Plan  ASA: II  Anesthesia Plan: General   Post-op Pain Management:    Induction: Intravenous  Airway Management Planned: LMA  Additional Equipment:   Intra-op Plan:   Post-operative Plan: Extubation in OR  Informed Consent: I have reviewed the patients History and Physical, chart, labs and discussed the procedure including the risks, benefits and alternatives for the proposed anesthesia with the patient or authorized representative who has indicated his/her understanding and acceptance.   Dental advisory given  Plan Discussed with: CRNA and Surgeon  Anesthesia Plan Comments:         Anesthesia Quick Evaluation

## 2015-06-20 ENCOUNTER — Encounter (HOSPITAL_BASED_OUTPATIENT_CLINIC_OR_DEPARTMENT_OTHER): Payer: Self-pay | Admitting: Urology

## 2015-06-20 LAB — SICKLE CELL SCREEN: Sickle Cell Screen: NEGATIVE

## 2015-06-20 NOTE — Anesthesia Postprocedure Evaluation (Signed)
Anesthesia Post Note  Patient: Caleb Sharp  Procedure(s) Performed: Procedure(s) (LRB): CYSTOSCOPY WITH FULGERATION (N/A)  Patient location during evaluation: PACU Anesthesia Type: General Level of consciousness: awake and alert Pain management: pain level controlled Vital Signs Assessment: post-procedure vital signs reviewed and stable Respiratory status: spontaneous breathing, nonlabored ventilation, respiratory function stable and patient connected to nasal cannula oxygen Cardiovascular status: blood pressure returned to baseline and stable Postop Assessment: no signs of nausea or vomiting Anesthetic complications: no    Last Vitals:  Filed Vitals:   06/19/15 1341 06/19/15 1418  BP:  135/83  Pulse: 99 98  Temp:  37 C  Resp: 13 16    Last Pain:  Filed Vitals:   06/19/15 1420  PainSc: 0-No pain                 Quinteria Chisum,JAMES TERRILL

## 2015-12-31 ENCOUNTER — Emergency Department (HOSPITAL_COMMUNITY): Payer: BLUE CROSS/BLUE SHIELD | Admitting: Anesthesiology

## 2015-12-31 ENCOUNTER — Ambulatory Visit (HOSPITAL_BASED_OUTPATIENT_CLINIC_OR_DEPARTMENT_OTHER)
Admission: EM | Admit: 2015-12-31 | Discharge: 2015-12-31 | Disposition: A | Discharge status: Home / Self Care | Payer: BLUE CROSS/BLUE SHIELD | Attending: Emergency Medicine | Admitting: Emergency Medicine

## 2015-12-31 ENCOUNTER — Encounter (HOSPITAL_COMMUNITY)
Admission: EM | Disposition: A | Discharge status: Home / Self Care | Payer: Self-pay | Source: Home / Self Care | Attending: Emergency Medicine

## 2015-12-31 ENCOUNTER — Encounter (HOSPITAL_BASED_OUTPATIENT_CLINIC_OR_DEPARTMENT_OTHER): Payer: Self-pay | Admitting: *Deleted

## 2015-12-31 DIAGNOSIS — N365 Urethral false passage: Secondary | ICD-10-CM | POA: Insufficient documentation

## 2015-12-31 DIAGNOSIS — Z7984 Long term (current) use of oral hypoglycemic drugs: Secondary | ICD-10-CM | POA: Insufficient documentation

## 2015-12-31 DIAGNOSIS — N359 Urethral stricture, unspecified: Secondary | ICD-10-CM | POA: Insufficient documentation

## 2015-12-31 DIAGNOSIS — Z79899 Other long term (current) drug therapy: Secondary | ICD-10-CM | POA: Diagnosis not present

## 2015-12-31 DIAGNOSIS — E119 Type 2 diabetes mellitus without complications: Secondary | ICD-10-CM | POA: Insufficient documentation

## 2015-12-31 DIAGNOSIS — R338 Other retention of urine: Secondary | ICD-10-CM | POA: Insufficient documentation

## 2015-12-31 DIAGNOSIS — N35912 Unspecified bulbous urethral stricture, male: Secondary | ICD-10-CM | POA: Diagnosis present

## 2015-12-31 DIAGNOSIS — Z87891 Personal history of nicotine dependence: Secondary | ICD-10-CM | POA: Insufficient documentation

## 2015-12-31 HISTORY — PX: CYSTOSCOPY: SHX5120

## 2015-12-31 LAB — I-STAT CHEM 8, ED
BUN: 14 mg/dL (ref 6–20)
Calcium, Ion: 1.09 mmol/L — ABNORMAL LOW (ref 1.15–1.40)
Chloride: 103 mmol/L (ref 101–111)
Creatinine, Ser: 0.9 mg/dL (ref 0.61–1.24)
Glucose, Bld: 297 mg/dL — ABNORMAL HIGH (ref 65–99)
HCT: 52 % (ref 39.0–52.0)
Hemoglobin: 17.7 g/dL — ABNORMAL HIGH (ref 13.0–17.0)
Potassium: 4.2 mmol/L (ref 3.5–5.1)
Sodium: 137 mmol/L (ref 135–145)
TCO2: 23 mmol/L (ref 0–100)

## 2015-12-31 LAB — URINALYSIS, ROUTINE W REFLEX MICROSCOPIC
Bilirubin Urine: NEGATIVE
Glucose, UA: >1000 mg/dL — AB
Ketones, ur: 15 mg/dL — AB
Nitrite: NEGATIVE
Protein, ur: 100 mg/dL — AB
Specific Gravity, Urine: 1.039 — ABNORMAL HIGH (ref 1.005–1.030)
pH: 5.5 (ref 5.0–8.0)

## 2015-12-31 LAB — URINE MICROSCOPIC-ADD ON

## 2015-12-31 LAB — GLUCOSE, CAPILLARY: Glucose-Capillary: 282 mg/dL — ABNORMAL HIGH (ref 65–99)

## 2015-12-31 SURGERY — CYSTOSCOPY
Anesthesia: General | Site: Urethra

## 2015-12-31 MED ORDER — HYDROMORPHONE HCL 1 MG/ML IJ SOLN: 0.2500 mg | INTRAMUSCULAR | Status: DC | PRN

## 2015-12-31 MED ORDER — LACTATED RINGERS IV SOLN
INTRAVENOUS | Status: DC | PRN
  Administered 2015-12-31: 05:00:00 via INTRAVENOUS

## 2015-12-31 MED ORDER — OXYCODONE HCL 5 MG PO TABS: 5.0000 mg | ORAL_TABLET | ORAL | Status: DC | PRN

## 2015-12-31 MED ORDER — HYDROMORPHONE HCL 1 MG/ML IJ SOLN
1.0000 mg | Freq: Once | INTRAMUSCULAR | Status: AC
  Administered 2015-12-31: 1 mg via INTRAVENOUS
  Filled 2015-12-31: qty 1

## 2015-12-31 MED ORDER — LIDOCAINE HCL 2 % EX GEL
1.0000 "application " | Freq: Once | CUTANEOUS | Status: AC
  Administered 2015-12-31: 1 via URETHRAL
  Filled 2015-12-31: qty 20

## 2015-12-31 MED ORDER — ONDANSETRON HCL 4 MG/2ML IJ SOLN
INTRAMUSCULAR | Status: DC | PRN
  Administered 2015-12-31: 4 mg via INTRAVENOUS

## 2015-12-31 MED ORDER — LIDOCAINE 2% (20 MG/ML) 5 ML SYRINGE
INTRAMUSCULAR | Status: DC | PRN
  Administered 2015-12-31: 50 mg via INTRAVENOUS

## 2015-12-31 MED ORDER — SODIUM CHLORIDE 0.9 % IV SOLN: 250.0000 mL | INTRAVENOUS | Status: DC | PRN

## 2015-12-31 MED ORDER — PROPOFOL 10 MG/ML IV BOLUS
INTRAVENOUS | Status: AC
  Filled 2015-12-31: qty 20

## 2015-12-31 MED ORDER — CEFAZOLIN SODIUM-DEXTROSE 2-4 GM/100ML-% IV SOLN
INTRAVENOUS | Status: AC
  Filled 2015-12-31: qty 100

## 2015-12-31 MED ORDER — MIDAZOLAM HCL 5 MG/5ML IJ SOLN
INTRAMUSCULAR | Status: DC | PRN
  Administered 2015-12-31: 2 mg via INTRAVENOUS

## 2015-12-31 MED ORDER — SODIUM CHLORIDE 0.9% FLUSH: 3.0000 mL | INTRAVENOUS | Status: DC | PRN

## 2015-12-31 MED ORDER — HYDROMORPHONE HCL 1 MG/ML IJ SOLN
INTRAMUSCULAR | Status: AC
  Filled 2015-12-31: qty 1

## 2015-12-31 MED ORDER — FENTANYL CITRATE (PF) 100 MCG/2ML IJ SOLN: 25.0000 ug | INTRAMUSCULAR | Status: DC | PRN

## 2015-12-31 MED ORDER — FENTANYL CITRATE (PF) 100 MCG/2ML IJ SOLN
INTRAMUSCULAR | Status: DC | PRN
  Administered 2015-12-31 (×2): 50 ug via INTRAVENOUS

## 2015-12-31 MED ORDER — CEFAZOLIN SODIUM-DEXTROSE 2-3 GM-% IV SOLR
INTRAVENOUS | Status: DC | PRN
  Administered 2015-12-31: 2 g via INTRAVENOUS

## 2015-12-31 MED ORDER — ACETAMINOPHEN 650 MG RE SUPP
650.0000 mg | RECTAL | Status: DC | PRN
  Filled 2015-12-31: qty 1

## 2015-12-31 MED ORDER — ONDANSETRON HCL 4 MG/2ML IJ SOLN
4.0000 mg | Freq: Once | INTRAMUSCULAR | Status: AC
  Administered 2015-12-31: 4 mg via INTRAVENOUS
  Filled 2015-12-31: qty 2

## 2015-12-31 MED ORDER — PROPOFOL 10 MG/ML IV BOLUS
INTRAVENOUS | Status: DC | PRN
  Administered 2015-12-31: 200 mg via INTRAVENOUS
  Administered 2015-12-31: 100 mg via INTRAVENOUS

## 2015-12-31 MED ORDER — MIDAZOLAM HCL 2 MG/2ML IJ SOLN
INTRAMUSCULAR | Status: AC
Start: 2015-12-31 — End: 2015-12-31
  Filled 2015-12-31: qty 2

## 2015-12-31 MED ORDER — LIDOCAINE HCL 2 % EX GEL
CUTANEOUS | Status: AC
  Administered 2015-12-31: 11
  Filled 2015-12-31: qty 11

## 2015-12-31 MED ORDER — FENTANYL CITRATE (PF) 100 MCG/2ML IJ SOLN
INTRAMUSCULAR | Status: AC
  Filled 2015-12-31: qty 2

## 2015-12-31 MED ORDER — HYDROCODONE-ACETAMINOPHEN 5-325 MG PO TABS: 1.0000 | ORAL_TABLET | Freq: Four times a day (QID) | ORAL | 0 refills | Status: DC | PRN

## 2015-12-31 MED ORDER — STERILE WATER FOR IRRIGATION IR SOLN
Status: DC | PRN
  Administered 2015-12-31: 1000 mL

## 2015-12-31 MED ORDER — ACETAMINOPHEN 325 MG PO TABS: 650.0000 mg | ORAL_TABLET | ORAL | Status: DC | PRN

## 2015-12-31 MED ORDER — SODIUM CHLORIDE 0.9% FLUSH: 3.0000 mL | Freq: Two times a day (BID) | INTRAVENOUS | Status: DC

## 2015-12-31 SURGICAL SUPPLY — 12 items
BAG URO CATCHER STRL LF (MISCELLANEOUS) ×2 IMPLANT
CATH BONANNO SUPRAPUBIC 14G (CATHETERS) ×2 IMPLANT
CATH URET 5FR 28IN OPEN ENDED (CATHETERS) IMPLANT
CLOTH BEACON ORANGE TIMEOUT ST (SAFETY) ×2 IMPLANT
GLOVE SURG SS PI 8.0 STRL IVOR (GLOVE) IMPLANT
GOWN STRL REUS W/TWL XL LVL3 (GOWN DISPOSABLE) ×2 IMPLANT
GUIDEWIRE STR DUAL SENSOR (WIRE) ×2 IMPLANT
MANIFOLD NEPTUNE II (INSTRUMENTS) ×2 IMPLANT
PACK CYSTO (CUSTOM PROCEDURE TRAY) ×2 IMPLANT
SPONGE DRAIN TRACH 4X4 STRL 2S (GAUZE/BANDAGES/DRESSINGS) ×2 IMPLANT
TAPE CLOTH SURG 4X10 WHT LF (GAUZE/BANDAGES/DRESSINGS) ×2 IMPLANT
TUBING CONNECTING 10 (TUBING) ×2 IMPLANT

## 2015-12-31 NOTE — Brief Op Note (Signed)
12/31/2015  5:49 AM  PATIENT:  Caleb Sharp  30 y.o. male  PRE-OPERATIVE DIAGNOSIS:  Urinary retension  POST-OPERATIVE DIAGNOSIS: Urethral stricture with retention   PROCEDURE:  Procedure(s): CYSTOSCOPY TROCAR SUPRAPUBIC TUBE INSERTION  SURGEON:  Surgeon(s) and Role:    * Bjorn PippinJohn Adean Milosevic, MD - Primary  PHYSICIAN ASSISTANT:   ASSISTANTS: none   ANESTHESIA:   general  EBL:  Total I/O In: 400 [I.V.:400] Out: -   BLOOD ADMINISTERED:none  DRAINS: 14FR BONANO SP TUBE   LOCAL MEDICATIONS USED:  NONE  SPECIMEN:  Source of Specimen:  URINE FOR CULTURE  DISPOSITION OF SPECIMEN:  PATHOLOGY  COUNTS:  YES  TOURNIQUET:  * No tourniquets in log *  DICTATION: .Other Dictation: Dictation Number [id number]  PLAN OF CARE: Discharge to home after PACU  PATIENT DISPOSITION:  PACU - hemodynamically stable.   Delay start of Pharmacological VTE agent (>24hrs) due to surgical blood loss or risk of bleeding: not applicable

## 2015-12-31 NOTE — ED Notes (Signed)
MD at bedside. 

## 2015-12-31 NOTE — ED Provider Notes (Signed)
Patient transferred for urologic intervention. Patient seen for urinary retention and Foley catheter placement was unsuccessful at outside institution. At arrival, patient complaining of severe discomfort in the bladder area.  Will check creatinine due to prolonged urinary retention. Patient will also be provided IV analgesia. Await urology for Foley catheter placement.   Caleb Creasehristopher J Pollina, MD 12/31/15 [phone]

## 2015-12-31 NOTE — ED Triage Notes (Signed)
Pt c/o " unable to urinate" x 12hrs

## 2015-12-31 NOTE — Anesthesia Preprocedure Evaluation (Signed)
Anesthesia Evaluation  Patient identified by MRN, date of birth, ID band Patient awake    Reviewed: Allergy & Precautions, H&P , NPO status , Patient's Chart, lab work & pertinent test results  Airway Mallampati: II  TM Distance: >3 FB Neck ROM: Full    Dental no notable dental hx. (+) Teeth Intact, Dental Advisory Given   Pulmonary neg pulmonary ROS, former smoker,    Pulmonary exam normal breath sounds clear to auscultation       Cardiovascular negative cardio ROS   Rhythm:Regular Rate:Normal     Neuro/Psych negative neurological ROS  negative psych ROS   GI/Hepatic negative GI ROS, Neg liver ROS,   Endo/Other  diabetes, Type 2, Oral Hypoglycemic Agents  Renal/GU negative Renal ROS  negative genitourinary   Musculoskeletal   Abdominal   Peds  Hematology negative hematology ROS (+)   Anesthesia Other Findings   Reproductive/Obstetrics negative OB ROS                             Anesthesia Physical Anesthesia Plan  ASA: II and emergent  Anesthesia Plan: General   Post-op Pain Management:    Induction: Intravenous  Airway Management Planned: LMA and Oral ETT  Additional Equipment:   Intra-op Plan:   Post-operative Plan: Extubation in OR  Informed Consent: I have reviewed the patients History and Physical, chart, labs and discussed the procedure including the risks, benefits and alternatives for the proposed anesthesia with the patient or authorized representative who has indicated his/her understanding and acceptance.   Dental advisory given  Plan Discussed with: CRNA  Anesthesia Plan Comments:         Anesthesia Quick Evaluation

## 2015-12-31 NOTE — Anesthesia Procedure Notes (Signed)
Procedure Name: LMA Insertion Performed by: Symon Norwood J Pre-anesthesia Checklist: Patient identified, Emergency Drugs available, Suction available, Patient being monitored and Timeout performed Patient Re-evaluated:Patient Re-evaluated prior to inductionOxygen Delivery Method: Circle system utilized Preoxygenation: Pre-oxygenation with 100% oxygen Intubation Type: IV induction Ventilation: Mask ventilation without difficulty LMA: LMA inserted LMA Size: 4.0 Number of attempts: 2 Placement Confirmation: positive ETCO2,  CO2 detector and breath sounds checked- equal and bilateral Tube secured with: Tape Dental Injury: Teeth and Oropharynx as per pre-operative assessment      

## 2015-12-31 NOTE — ED Notes (Signed)
Unsuccessful attempt at insertion of Coude catheter by Vinson MoselleSam Coble, RN - assisted by myself.  Pt tolerated the attempt well.  Catheter advanced >6inches but would not pass the obstruction. Pt has attempted twice more to void himself but has not been able.

## 2015-12-31 NOTE — ED Notes (Signed)
[facility]

## 2015-12-31 NOTE — ED Provider Notes (Signed)
[facility] Provider Note: Caleb Sharp Caleb Nofziger, MD, FACEP  CSN: [id number] MRN: [id number] ARRIVAL: 12/31/15 at 0020 ROOM: [facility]12/[facility]12   CHIEF COMPLAINT  Urinary Retention   HISTORY OF PRESENT ILLNESS  Caleb Sharp is a 30 y.o. male with a history of urinary retention. He is here with an inability to void his bladder since about 4 PM. He was only able to void a few milliliters on arrival. He is not having frank pain but does have the sensation that he needs to urinate. He denies fever, chills, nausea, vomiting or diarrhea. Bladder scan performed by nurse shows approximately 365 milliliters of urine in the bladder.   Past Medical History:  Diagnosis Date  . Foley catheter in place   . Gross hematuria   . Type 2 diabetes mellitus (HCC)   . Urinary retention     Past Surgical History:  Procedure Laterality Date  . CYSTOSCOPY WITH FULGERATION N/A 06/19/2015   Procedure: CYSTOSCOPY WITH FULGERATION;  Surgeon: Barron Alvineavid Grapey, MD;  [facility];  Service: Urology;  Laterality: N/A;  . NO PAST SURGERIES      History reviewed. No pertinent family history.  Social History  Substance Use Topics  . Smoking status: Former Smoker    Packs/day: 1.00    Years: 3.00    Types: Cigarettes    Quit date: 12/31/2014  . Smokeless tobacco: Never Used  . Alcohol use Yes     Comment: occasional    Prior to Admission medications   Medication Sig Start Date End Date Taking? Authorizing Provider  metFORMIN (GLUCOPHAGE) 1000 MG tablet Take 1,000 mg by mouth 2 (two) times daily with a meal.    Historical Provider, MD  phenazopyridine (PYRIDIUM) 200 MG tablet Take 1 tablet (200 mg total) by mouth 3 (three) times daily as needed for pain. 06/19/15   Barron Alvineavid Grapey, MD  silodosin (RAPAFLO) 4 MG CAPS capsule Take 4 mg by mouth daily with breakfast.    Historical Provider, MD    Allergies Other   REVIEW OF SYSTEMS  Negative except as noted here or in the History of  Present Illness.   PHYSICAL EXAMINATION  Initial Vital Signs Blood pressure 132/98, pulse 100, temperature 98.3 F (36.8 C), temperature source Oral, resp. rate 18, height 5\' 6"  (1.676 m), weight 213 lb (96.6 kg), SpO2 96 %.  Examination General: Well-developed, well-nourished male in no acute distress; appearance consistent with age of record HENT: normocephalic; atraumatic Eyes: pupils equal, round and reactive to light; extraocular muscles intact Neck: supple Heart: regular rate and rhythm Lungs: clear to auscultation bilaterally Abdomen: soft; distended bladder seen on bedside ultrasound; nontender; no masses or hepatosplenomegaly; bowel sounds present Extremities: No deformity; full range of motion; pulses normal Neurologic: Awake, alert and oriented; motor function intact in all extremities and symmetric; no facial droop Skin: Warm and dry Psychiatric: Anxious   RESULTS  Summary of this visit's results, reviewed by myself:   EKG Interpretation  Date/Time:    Ventricular Rate:    PR Interval:    QRS Duration:   QT Interval:    QTC Calculation:   R Axis:     Text Interpretation:        Laboratory Studies: Results for orders placed or performed during the hospital encounter of 12/31/15 (from the past 24 hour(s))  Urinalysis, Routine w reflex microscopic (not at [facility])     Status: Abnormal   Collection Time: 12/31/15 12:40 AM  Result Value Ref Range  Color, Urine YELLOW YELLOW   APPearance CLOUDY (A) CLEAR   Specific Gravity, Urine 1.039 (H) 1.005 - 1.030   pH 5.5 5.0 - 8.0   Glucose, UA >1000 (A) NEGATIVE mg/dL   Hgb urine dipstick LARGE (A) NEGATIVE   Bilirubin Urine NEGATIVE NEGATIVE   Ketones, ur 15 (A) NEGATIVE mg/dL   Protein, ur 161100 (A) NEGATIVE mg/dL   Nitrite NEGATIVE NEGATIVE   Leukocytes, UA SMALL (A) NEGATIVE  Urine microscopic-add on     Status: Abnormal   Collection Time: 12/31/15 12:40 AM  Result Value Ref Range   Squamous Epithelial / LPF 0-5  (A) NONE SEEN   WBC, UA 0-5 0 - 5 WBC/hpf   RBC / HPF TOO NUMEROUS TO COUNT 0 - 5 RBC/hpf   Bacteria, UA RARE (A) NONE SEEN   Imaging Studies: No results found.  Sharp COURSE  Nursing notes and initial vitals signs, including pulse oximetry, reviewed.  Vitals:   12/31/15 0026 12/31/15 0027 12/31/15 0210  BP: 132/98  (!) 157/110  Pulse: 100  102  Resp: 18  16  Temp: 98.3 F (36.8 C)    TempSrc: Oral    SpO2: 96%  98%  Weight:  213 lb (96.6 kg)   Height:  5\' 6"  (1.676 m)    2:39 AM Multiple attempts were made to place both a Foley catheter coud catheter without success. The catheters went in about 6 inches but hit a hard obstruction every time. We will transfer him to [facility] where Dr. Annabell HowellsWrenn of urology has agreed to see him.  PROCEDURES    Sharp DIAGNOSES     ICD-9-CM ICD-10-CM   1. Acute urinary retention 788.29 R33.8        Paula LibraJohn Maxson Oddo, MD 12/31/15 [phone]

## 2015-12-31 NOTE — Consult Note (Signed)
Subjective: CC: I can't pee.  Hx: Caleb Sharp is a 30 yo AA male who has a history of retention and prior cystoscopy with fulguration in 4/17 by Dr. Isabel CapriceGrapey.  I was asked to see him in consultation by Dr. Read DriversMolpus for retention that began acutely at 11pm on 10/30.  Attempt at foley placement at [facility] was unsuccessful.  [facility] and [facility].   He is currently very uncomfortable and unable to void.   He has DM but no symptoms of neuropathy.    ROS:  Review of Systems  Gastrointestinal: Positive for abdominal pain.  All other systems reviewed and are negative.   Allergies  Allergen Reactions  . Other     Coconut-- n/v     Past Medical History:  Diagnosis Date  . Foley catheter in place   . Gross hematuria   . Type 2 diabetes mellitus (HCC)   . Urinary retention     Past Surgical History:  Procedure Laterality Date  . CYSTOSCOPY WITH FULGERATION N/A 06/19/2015   Procedure: CYSTOSCOPY WITH FULGERATION;  Surgeon: Barron Alvineavid Grapey, MD;  [facility];  Service: Urology;  Laterality: N/A;  . NO PAST SURGERIES      Social History   Social History  . Marital status: Single    Spouse name: N/A  . Number of children: N/A  . Years of education: N/A   Occupational History  . Not on file.   Social History Main Topics  . Smoking status: Former Smoker    Packs/day: 1.00    Years: 3.00    Types: Cigarettes    Quit date: 12/31/2014  . Smokeless tobacco: Never Used  . Alcohol use Yes     Comment: occasional  . Drug use: No  . Sexual activity: Not on file   Other Topics Concern  . Not on file   Social History Narrative  . No narrative on file    History reviewed. No pertinent family history.  Anti-infectives: Anti-infectives    None      Current Facility-Administered Medications  Medication Dose Route Frequency Provider Last Rate Last Dose  . HYDROmorphone (DILAUDID) 1 MG/ML injection           . lidocaine  (XYLOCAINE) 2 % jelly            Current Outpatient Prescriptions  Medication Sig Dispense Refill  . metFORMIN (GLUCOPHAGE) 1000 MG tablet Take 1,000 mg by mouth 2 (two) times daily with a meal.    . phenazopyridine (PYRIDIUM) 200 MG tablet Take 1 tablet (200 mg total) by mouth 3 (three) times daily as needed for pain. (Patient not taking: Reported on 12/31/2015) 30 tablet 0   PMSFHx reviewed and updated.   Objective: Vital signs in last 24 hours: Temp:  [98.3 F (36.8 C)] 98.3 F (36.8 C) (10/31 0026) Pulse Rate:  [100-102] 102 (10/31 0210) Resp:  [16-18] 16 (10/31 0210) BP: (132-157)/(98-110) 157/110 (10/31 0210) SpO2:  [96 %-98 %] 98 % (10/31 0210) Weight:  [96.6 kg (213 lb)] 96.6 kg (213 lb) (10/31 0027)  Intake/Output from previous day: No intake/output data recorded. Intake/Output this shift: No intake/output data recorded.   Physical Exam  Constitutional: He is oriented to person, place, and time.  WD, WN in obvious discomfort  HENT:  Head: Normocephalic and atraumatic.  Neck: Normal range of motion. Neck supple.  Cardiovascular: Regular rhythm and normal heart sounds.   tachycardic  Pulmonary/Chest: Effort normal and  breath sounds normal.  Abdominal: Soft. He exhibits mass (suprapubic). There is tenderness (suprapubic).  Genitourinary: Rectum normal, prostate normal and penis normal.  Genitourinary Comments: Prostate is small and benign.  NST.  No masses.  Scrotum and contents normal.  Circ phallus with normal meatus.  Musculoskeletal: Normal range of motion. He exhibits no edema or tenderness.  Neurological: He is alert and oriented to person, place, and time.  Skin: Skin is warm and dry.  Psychiatric:  anxious    Lab Results:  No results for input(s): WBC, HGB, HCT, PLT in the last 72 hours. BMET No results for input(s): NA, K, CL, CO2, GLUCOSE, BUN, CREATININE, CALCIUM in the last 72 hours. PT/INR No results for input(s): LABPROT, INR in the last 72  hours. ABG No results for input(s): PHART, HCO3 in the last 72 hours.  Invalid input(s): PCO2, PO2  Studies/Results: No results found.   Bladder scan PVR was 400ml at [facility].  Records reviewed.   I attempted to place an 3718fr coude catheter after a betadine prep and lidocaine jelly instillation but was unable to get it past the proximal urethral/prostate and there was bleeding from the meatus post attempt.  Assessment: Urinary retention with possible stricture vs false passage.  I am going to take him to the OR for cystoscopy, possible balloon dilation and foley placement.    I reviewed the risks of bleeding, infection, urethral injury, need for a suprapubic tube or secondary procedure, thrombotic events and anesthetic complications.       CC: Dr. Maurice MarchLane Molpus and Dr. Barron Alvineavid Grapey.      Caleb Sharp 12/31/2015 [phone]

## 2015-12-31 NOTE — Progress Notes (Signed)
  December 31, 2015  Patient: Caleb Sharp  Date of Birth: 11-22-1985  Date of Visit: 12/31/2015    To Whom It May Concern:  Caleb Sharp was seen and treated in our emergency department and [facility] 12[facility].   Sincerely,   Facilities manageronya Shelton Soler RN

## 2015-12-31 NOTE — Transfer of Care (Signed)
Immediate Anesthesia Transfer of Care Note  Patient: Caleb Sharp  Procedure(s) Performed: Procedure(s): CYSTOSCOPY, URETEROSCOPY, SUPRAPUBIC TUBE PLACEMENT (N/A)  Patient Location: PACU  Anesthesia Type:General  Level of Consciousness: sedated, patient cooperative and responds to stimulation  Airway & Oxygen Therapy: Patient Spontanous Breathing and Patient connected to face mask oxygen  Post-op Assessment: Report given to RN and Post -op Vital signs reviewed and stable  Post vital signs: Reviewed and stable  Last Vitals:  Vitals:   12/31/15 0026 12/31/15 0210  BP: 132/98 (!) 157/110  Pulse: 100 102  Resp: 18 16  Temp: 36.8 C     Last Pain:  Vitals:   12/31/15 0430  TempSrc:   PainSc: 10-Worst pain ever         Complications: No apparent anesthesia complications

## 2015-12-31 NOTE — Discharge Instructions (Addendum)
Suprapubic Catheter Home Guide A suprapubic catheter is a rubber tube with a tiny balloon on the end. It is used to drain urine from the bladder. This catheter is put in your bladder through a small opening in the lower center part of your abdomen. Suprapubic refers to the area right above your pubic bone. The balloon on the end of the catheter is filled with germ-free (sterile) water. This keeps the catheter from slipping out. When the catheter is in place, your urine will drain into a collection bag. The bag can be put beside your bed at night or attached to your leg during the day. HOW TO CARE FOR YOUR CATHETER  Cleaning your skin  Clean the skin around the catheter opening every day.  Wash your hands with soap and water.  Clean the skin around the opening with a clean washcloth and soapy water. Do not pull on the tube.  Pat the area dry with a clean towel.  Your caregiver may want you to put a bandage (dressing) over the site. Do not use ointment on this area unless your caregiver tells you to.   Cleaning the catheter  Ask your caregiver if you need to clean the catheter and how often.  Use only soap and water.  There may be crusts on the catheter. Put hydrogen peroxide on a cotton ball or gauze pad to remove any crust. Emptying the collection bag  You may have a large drainage bag to use at night and a smaller one for daytime. Empty the large bag every 8 hours. Empty the small bag when it is about  full.  Keep the drainage bag below the level of the catheter. This keeps urine from flowing backwards.  Hold the bag over the toilet or another container. Release the valve (spigot) at the bottom of the bag. Do not touch the opening of the spigot. Do not let the opening touch the toilet or container.  Close the spigot tightly when the bag is empty.  Always make sure there are no kinks in the catheter or tubing.  Always make sure there are no leaks in the catheter, tubing, or  collection bag.  HOW TO CHANGE YOUR CATHETER Sometimes, a caregiver will change your suprapubic catheter. Other times, you may need to change it yourself. This may be the case if you need to wear a catheter for a long time. Usually, they need to be changed every 4 to 6 weeks. Ask your caregiver how often yours should be changed. Your caregiver will help you order the following supplies for home delivery:  Sterile gloves.  Catheters.  Syringes.  Sterile water.  Sterile cleaning solution.  Lubricant.  Drainage bags. Changing your catheter  Drink plenty of fluids before changing the catheter.  Wash your hands with soap and water.  Lie on your back and put on sterile gloves.  Clean the skin around the catheter opening. Use the sterile cleaning solution.  Use a syringe to get the water out of the balloon from the old catheter.  Slowly remove the catheter.  Take off the first pair of gloves, and put on a new pair. Then put lubricant on the tip of the new catheter. Put the new catheter through the opening.  Wait for some urine to start flowing. Then, use the other syringe to fill the balloon with sterile water.  Attach the catheter to your drainage bag. Make sure the connection is tight. Important warnings  The catheter should come out  easily. If it seems stuck, do not pull it.  Call your caregiver right away if you have any trouble while changing the catheter.  When the old catheter is removed, the new one should be put in right away. This is because the opening will close quickly. If you have a problem, go to an emergency clinic right away. RISKS AND COMPLICATIONS  Urine flow can become blocked. This can happen if the catheter or tubes are not working right. A blood clot can also block urine flow.  The catheter might irritate tissue in your body. This can cause bleeding.  The skin near the opening for the catheter may become irritated or infected.  Bacteria may get  into your bladder. This can cause a urinary tract infection. HOME CARE INSTRUCTIONS  Take all medicines prescribed by your caregiver. Follow the directions carefully.  Drink 8 glasses of water every day. This produces good urine flow.  Check the skin around your catheter a few times every day. Watch for redness and swelling. Look for any fluids coming out of the opening.  Do not use powder or cream around the catheter opening.  Do not take tub baths or use pools or hot tubs.  Keep all follow-up appointments. SEEK MEDICAL CARE IF:  You leak urine.  Your skin around the catheter becomes red or sore.  Your urine flow slows down.  Your urine gets cloudy or smelly. SEEK IMMEDIATE MEDICAL CARE IF:   You have chills, nausea, or back pain.  You have trouble changing your catheter.  Your catheter comes out.  You have blood in your urine.  You have no urine flow for 1 hour.  You have a fever.   This information is not intended to replace advice given to you by your health care provider. Make sure you discuss any questions you have with your health care provider.   Document Released: 11/04/2010 Document Revised: 07/03/2014 Document Reviewed: 11/04/2010 Elsevier Interactive Patient Education 2016 Elsevier Inc.  Cystoscopy, Care After Refer to this sheet in the next few weeks. These instructions provide you with information on caring for yourself after your procedure. Your caregiver may also give you more specific instructions. Your treatment has been planned according to current medical practices, but problems sometimes occur. Call your caregiver if you have any problems or questions after your procedure. HOME CARE INSTRUCTIONS  Things you can do to ease any discomfort after your procedure include:  Drinking enough water and fluids to keep your urine clear or pale yellow.  Taking a warm bath to relieve any burning feelings. SEEK IMMEDIATE MEDICAL CARE IF:   You have an  increase in blood in your urine.  You notice blood clots in your urine.  You have difficulty passing urine.  You have the chills.  You have abdominal pain.  You have a fever or persistent symptoms for more than 2-3 days.  You have a fever and your symptoms suddenly get worse. MAKE SURE YOU:   Understand these instructions.  Will watch your condition.  Will get help right away if you are not doing well or get worse.   This information is not intended to replace advice given to you by your health care provider. Make sure you discuss any questions you have with your health care provider.   Document Released: 09/05/2004 Document Revised: 03/09/2014 Document Reviewed: 08/10/2011 Elsevier Interactive Patient Education 2016 Elsevier Inc.  General Anesthesia, Adult, Care After Refer to this sheet in the next few weeks. These  instructions provide you with information on caring for yourself after your procedure. Your health care provider may also give you more specific instructions. Your treatment has been planned according to current medical practices, but problems sometimes occur. Call your health care provider if you have any problems or questions after your procedure. WHAT TO EXPECT AFTER THE PROCEDURE After the procedure, it is typical to experience:  Sleepiness.  Nausea and vomiting. HOME CARE INSTRUCTIONS  For the first 24 hours after general anesthesia:  Have a responsible person with you.  Do not drive a car. If you are alone, do not take public transportation.  Do not drink alcohol.  Do not take medicine that has not been prescribed by your health care provider.  Do not sign important papers or make important decisions.  You may resume a normal diet and activities as directed by your health care provider.  Change bandages (dressings) as directed.  If you have questions or problems that seem related to general anesthesia, call the hospital and ask for the  anesthetist or anesthesiologist on call. SEEK MEDICAL CARE IF:  You have nausea and vomiting that continue the day after anesthesia.  You develop a rash. SEEK IMMEDIATE MEDICAL CARE IF:   You have difficulty breathing.  You have chest pain.  You have any allergic problems.   This information is not intended to replace advice given to you by your health care provider. Make sure you discuss any questions you have with your health care provider.   Document Released: 05/25/2000 Document Revised: 03/09/2014 Document Reviewed: 06/17/2011 Elsevier Interactive Patient Education [employer] Elsevier Inc.

## 2015-12-31 NOTE — ED Notes (Signed)
1 unsuccessful attempt at Foley Catheter insertion.  Catheter went in approx 6 inches but would not advance. Pt had minimal pain.  No bleeding.  MD notified.

## 2016-01-01 LAB — URINE CULTURE: Culture: NO GROWTH

## 2016-01-01 NOTE — Anesthesia Postprocedure Evaluation (Signed)
Anesthesia Post Note  Patient: Caleb Sharp  Procedure(s) Performed: Procedure(s) (LRB): CYSTOSCOPY, URETEROSCOPY, SUPRAPUBIC TUBE PLACEMENT (N/A)  Patient location during evaluation: PACU Anesthesia Type: General Level of consciousness: awake and alert Pain management: pain level controlled Vital Signs Assessment: post-procedure vital signs reviewed and stable Respiratory status: spontaneous breathing, nonlabored ventilation and respiratory function stable Cardiovascular status: blood pressure returned to baseline and stable Postop Assessment: no signs of nausea or vomiting Anesthetic complications: no    Last Vitals:  Vitals:   12/31/15 0733 12/31/15 0850  BP: 135/76 (!) 140/98  Pulse: 94 93  Resp: 16 16  Temp: 36.6 C 36.6 C    Last Pain:  Vitals:   12/31/15 0930  TempSrc:   PainSc: 5                  Shervin Cypert,W. EDMOND

## 2016-01-01 NOTE — Op Note (Signed)
NAME:  Caleb Sharp, Caleb Sharp NO.:  [id number]  MEDICAL RECORD NO.:  [id number]  LOCATION:  [facility]                         FACILITY:  [facility]  PHYSICIAN:  Marshall Cork. Jeffie Pollock, M.D.    DATE OF BIRTH:  01/06/1986  DATE OF PROCEDURE:  12/31/2015 DATE OF DISCHARGE:  12/31/2015                              OPERATIVE REPORT   PROCEDURE:  Cystoscopy and insertion of trocar suprapubic tube.  PREOPERATIVE DIAGNOSIS:  Urinary retention with difficult Foley passage.  POSTOPERATIVE DIAGNOSIS:  Urinary retention with difficult Foley passage with ureteral stricture and false passage.  SURGEON:  Marshall Cork. Jeffie Pollock, M.D.  ANESTHESIA:  General.  SPECIMEN:  Urine for culture.  DRAIN:  14-French Bonanno suprapubic tube.  BLOOD LOSS:  Minimal.  COMPLICATIONS:  None.  INDICATIONS:  Mr. Caleb Sharp is a 30 year old African American male, who had prior cystoscopy by Dr. Risa Grill in April, 2017 for urinary retention.  He was not found to have any abnormalities other than a high bladder neck. He presented to the emergency room at [facility] today after going into retention again at about 11 o'clock last night.  He reports that he was voiding reasonably normally prior to that.  An attempted F[facility] catheter [facility] in [facility] was unsuccessful, so he was transferred to [facility].  I attempted passage of a well lubricated 18-French coude catheter, but met obstruction and felt that because of the patient's age and discomfort, cystoscopy in the operating room was most appropriate.  FINDINGS AND PROCEDURE:  He was taken to the operating room where he was given 2 g of Ancef.  A general anesthetic was induced.  He was placed in lithotomy position and was fitted with PAS hose.  His perineum and genitalia were prepped with Betadine solution.  He was draped in usual sterile fashion.  Cystoscopy was performed using a 23-French scope and 30-degree lens. Examination revealed a normal anterior  and distal urethra.  In the bulbar urethra, there was a small clot and a blind-ending urethra consistent with false passage and probable stricture.  Attempt to probe the area with a Sensor guidewire to find the lumen of the urethra was unsuccessful.  I then used a 6.5 [place] semi-rigid ureteroscope to try to get a close review of the area of concern in the urethra, but even using this technique, I was unable to identify urethral lumen either visually or with probing with the Sensor wire.  At this point, it was felt a suprapubic tube was indicated.  The top of the pubic bone was palpated and a small incision was made in the midline approximately 2-3 cm above the pubis with the patient in the Trendelenburg position.  A Bonanno 14-French trocar suprapubic tube was then passed through the incision into the bladder with return of clear urine.  The catheter was advanced off the trocar into the bladder and then the trocar was removed.  The drainage tubing was placed and clear urine drained into the bag.  The suprapubic tube was then secured to the skin using 2-0 nylon sutures.  The specimen was obtained from the bag to send for urine culture.  At this point, the  drapes removed and a dressing was applied.  The patient's anesthetic was reversed.  He was moved to recovery room in stable condition.  There were no complications.  He will need subsequent urethrogram and cystogram to better delineate the nature of his problem with another attempt at catheter placement in a later date.     Marshall Cork. Jeffie Pollock, M.D.     JJW/MEDQ  D:  12/31/2015  T:  12/31/2015  Job:  [id number]

## 2016-01-02 ENCOUNTER — Other Ambulatory Visit (HOSPITAL_COMMUNITY): Payer: Self-pay | Admitting: Urology

## 2016-01-02 DIAGNOSIS — R339 Retention of urine, unspecified: Secondary | ICD-10-CM

## 2016-01-07 ENCOUNTER — Encounter (HOSPITAL_COMMUNITY): Payer: Self-pay | Admitting: Urology

## 2016-01-08 ENCOUNTER — Ambulatory Visit (HOSPITAL_COMMUNITY): Admission: RE | Admit: 2016-01-08 | Payer: BLUE CROSS/BLUE SHIELD | Source: Ambulatory Visit

## 2016-01-08 ENCOUNTER — Other Ambulatory Visit (HOSPITAL_COMMUNITY): Payer: BLUE CROSS/BLUE SHIELD

## 2016-01-16 ENCOUNTER — Emergency Department (HOSPITAL_BASED_OUTPATIENT_CLINIC_OR_DEPARTMENT_OTHER)
Admission: EM | Admit: 2016-01-16 | Discharge: 2016-01-16 | Disposition: A | Discharge status: Home / Self Care | Payer: BLUE CROSS/BLUE SHIELD | Attending: Emergency Medicine | Admitting: Emergency Medicine

## 2016-01-16 ENCOUNTER — Encounter (HOSPITAL_BASED_OUTPATIENT_CLINIC_OR_DEPARTMENT_OTHER): Payer: Self-pay | Admitting: Emergency Medicine

## 2016-01-16 DIAGNOSIS — T83010A Breakdown (mechanical) of cystostomy catheter, initial encounter: Secondary | ICD-10-CM

## 2016-01-16 DIAGNOSIS — Z87891 Personal history of nicotine dependence: Secondary | ICD-10-CM | POA: Diagnosis not present

## 2016-01-16 DIAGNOSIS — E119 Type 2 diabetes mellitus without complications: Secondary | ICD-10-CM | POA: Diagnosis not present

## 2016-01-16 DIAGNOSIS — T83090A Other mechanical complication of cystostomy catheter, initial encounter: Secondary | ICD-10-CM | POA: Insufficient documentation

## 2016-01-16 DIAGNOSIS — Y732 Prosthetic and other implants, materials and accessory gastroenterology and urology devices associated with adverse incidents: Secondary | ICD-10-CM | POA: Diagnosis not present

## 2016-01-16 DIAGNOSIS — Z7984 Long term (current) use of oral hypoglycemic drugs: Secondary | ICD-10-CM | POA: Insufficient documentation

## 2016-01-16 MED ORDER — LIDOCAINE-EPINEPHRINE 2 %-1:100000 IJ SOLN
20.0000 mL | Freq: Once | INTRAMUSCULAR | Status: DC
Start: 2016-01-16 — End: 2016-01-16

## 2016-01-16 MED ORDER — LIDOCAINE-EPINEPHRINE (PF) 2 %-1:200000 IJ SOLN
20.0000 mL | Freq: Once | INTRAMUSCULAR | Status: AC
  Administered 2016-01-16: 20 mL

## 2016-01-16 MED ORDER — LIDOCAINE-EPINEPHRINE (PF) 2 %-1:200000 IJ SOLN
INTRAMUSCULAR | Status: AC
  Filled 2016-01-16: qty 20

## 2016-01-16 NOTE — Discharge Instructions (Signed)
Please try to prevent your catheter from getting pulled as it will break the sutures.  Follow up with your urologist for further care.

## 2016-01-16 NOTE — ED Triage Notes (Signed)
Patient states that he had a suprapubic catheter surgically placed. 2 days ago the stiches started to come out

## 2016-01-16 NOTE — ED Provider Notes (Signed)
[facility] Provider Note   CSN: [id number] Arrival date & time: 01/16/16  1653  By signing my name below, I, Emmanuella Mensah, attest that this documentation has been prepared under the direction and in the presence of Fayrene Helper, PA-C. Electronically Signed: Angelene Giovanni, ED Scribe. 01/16/16. 6:17 PM.   History   Chief Complaint Chief Complaint  Patient presents with  . Post-op Problem    HPI Comments: Caleb Sharp is a 30 y.o. male with a hx of DM who presents to the Emergency Department with a suprapubic catheter in place requesting evaluation of the stitches to the catheter. He explains that he had the catheter placed 2 days ago at [facility] by Dr. Annabell Howells (Urologist) for urinary retention. He states that although he is not having increased pain, the stiches are falling out and becoming loose. He adds that he noticed small amounts of blood in his urine yesterday and that he is currently able to intermittently urinate through his penis. No other alleviating factors noted. Pt has not tried any medications PTA for these symptoms. He denies any fever, chills, vomiting, or any other symptoms.   The history is provided by the patient. No language interpreter was used.    Past Medical History:  Diagnosis Date  . Foley catheter in place   . Gross hematuria   . Type 2 diabetes mellitus (HCC)   . Urinary retention     Patient Active Problem List   Diagnosis Date Noted  . 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 CYSTOSCOPY N/A 12/31/2015   Procedure: CYSTOSCOPY, URETEROSCOPY, SUPRAPUBIC TUBE PLACEMENT;  Surgeon: Bjorn Pippin, MD;  Location: [facility];  Service: Urology;  Laterality: N/A;  . CYSTOSCOPY WITH FULGERATION N/A 06/19/2015   Procedure: CYSTOSCOPY WITH FULGERATION;  Surgeon: Barron Alvine, MD;  Location: [facility];  Service: Urology;  Laterality: N/A;  . NO PAST SURGERIES         Home  Medications    Prior to Admission medications   Medication Sig Start Date End Date Taking? Authorizing Provider  HYDROcodone-acetaminophen (NORCO) 5-325 MG tablet Take 1 tablet by mouth every 6 (six) hours as needed for moderate pain. 12/31/15   Bjorn Pippin, MD  metFORMIN (GLUCOPHAGE) 1000 MG tablet Take 1,000 mg by mouth 2 (two) times daily with a meal.    Historical Provider, MD  phenazopyridine (PYRIDIUM) 200 MG tablet Take 1 tablet (200 mg total) by mouth 3 (three) times daily as needed for pain. Patient not taking: Reported on 12/31/2015 06/19/15   Barron Alvine, MD    Family History History reviewed. No pertinent family history.  Social History Social History  Substance Use Topics  . Smoking status: Former Smoker    Packs/day: 1.00    Years: 3.00    Types: Cigarettes    Quit date: 12/31/2014  . Smokeless tobacco: Never Used  . Alcohol use Yes     Comment: occasional     Allergies   Other   Review of Systems Review of Systems  Constitutional: Negative for chills and fever.  Gastrointestinal: Negative for vomiting.  Genitourinary: Positive for dysuria and hematuria.  All other systems reviewed and are negative.    Physical Exam Updated Vital Signs BP 148/91 (BP Location: Right Arm)   Pulse 94   Temp 98.4 F (36.9 C) (Oral)   Resp 18   Ht 5\' 6"  (1.676 m)   Wt 213 lb (96.6 kg)  SpO2 100%   BMI 34.38 kg/m   Physical Exam  Constitutional: He is oriented to person, place, and time. He appears well-developed and well-nourished.  HENT:  Head: Normocephalic and atraumatic.  Cardiovascular: Normal rate.   Pulmonary/Chest: Effort normal.  Abdominal:  Suprapubic catheter with sutures that has come out and loosely attached   Neurological: He is alert and oriented to person, place, and time.  Skin: Skin is warm and dry.  Psychiatric: He has a normal mood and affect.  Nursing note and vitals reviewed.    ED Treatments / Results  DIAGNOSTIC STUDIES: Oxygen  Saturation is 100% on RA, normal by my interpretation.    COORDINATION OF CARE: 6:01 PM- Pt advised of plan for treatment and pt agrees. Will repair loose stitches.   Labs (all labs ordered are listed, but only abnormal results are displayed) Labs Reviewed - No data to display  EKG  EKG Interpretation None       Radiology No results found.  Procedures .Marland Kitchen.Laceration Repair Date/Time: 01/16/2016 6:45 PM Performed by: Fayrene HelperRAN, Allure Greaser Authorized by: Fayrene HelperRAN, Payne Garske   Consent:    Consent obtained:  Verbal   Consent given by:  Patient   Risks discussed:  Need for additional repair   Alternatives discussed:  No treatment and referral Anesthesia (see MAR for exact dosages):    Anesthesia method:  Local infiltration   Local anesthetic:  Lidocaine 2% WITH epi Laceration details:    Location:  Trunk   Trunk location: suprapubic.   Length (cm):  2   Depth (mm):  3 Repair type:    Repair type:  Simple Pre-procedure details:    Preparation:  Patient was prepped and draped in usual sterile fashion Treatment:    Area cleansed with:  Betadine   Amount of cleaning:  Standard   Irrigation solution:  Sterile water Skin repair:    Repair method:  Sutures   Suture size:  4-0   Suture material:  Prolene   Suture technique:  Simple interrupted   Number of sutures:  2 Approximation:    Approximation:  Close Post-procedure details:    Dressing:  Open (no dressing)   Patient tolerance of procedure:  Tolerated well, no immediate complications Comments:     Suprapubic catheter requiring additional sutures to keep it in place.  I place 2 4.0 prolene simple interrupted sutures to hold it in place.    (including critical care time)    Medications Ordered in ED Medications - No data to display   Initial Impression / Assessment and Plan / ED Course  Fayrene HelperBowie Makenleigh Crownover, PA-C has reviewed the triage vital signs and the nursing notes.  Pertinent labs & imaging results that were available during my  care of the patient were reviewed by me and considered in my medical decision making (see chart for details).  Clinical Course     BP 148/91 (BP Location: Right Arm)   Pulse 94   Temp 98.4 F (36.9 C) (Oral)   Resp 18   Ht 5\' 6"  (1.676 m)   Wt 96.6 kg   SpO2 100%   BMI 34.38 kg/m    Final Clinical Impressions(s) / ED Diagnoses   Final diagnoses:  Suprapubic catheter dysfunction, initial encounter [facility](HCC)    New Prescriptions New Prescriptions   No medications on file   I personally performed the services described in this documentation, which was scribed in my presence. The recorded information has been reviewed and is accurate.   6:50 PM Pt  has a suprapubic catheter that is coming loose due to previous sutures have been pulled out of skin from tension.  I applied 2 sutures to hold it back in place.  Pt tolerates well.  He will f/u with urology for further care. Foley otherwise in good working condition.    Fayrene HelperBowie Churchill Grimsley, PA-C 01/16/16 1852    Vanetta MuldersScott Zackowski, MD 01/18/16 [phone]

## 2016-01-26 ENCOUNTER — Encounter (HOSPITAL_BASED_OUTPATIENT_CLINIC_OR_DEPARTMENT_OTHER): Payer: Self-pay | Admitting: *Deleted

## 2016-01-26 ENCOUNTER — Inpatient Hospital Stay (HOSPITAL_BASED_OUTPATIENT_CLINIC_OR_DEPARTMENT_OTHER)
Admission: EM | Admit: 2016-01-26 | Discharge: 2016-01-31 | DRG: 698 | Disposition: A | Discharge status: Home / Self Care | Payer: BLUE CROSS/BLUE SHIELD | Attending: Internal Medicine | Admitting: Internal Medicine

## 2016-01-26 DIAGNOSIS — Z87891 Personal history of nicotine dependence: Secondary | ICD-10-CM | POA: Diagnosis not present

## 2016-01-26 DIAGNOSIS — Z9359 Other cystostomy status: Secondary | ICD-10-CM

## 2016-01-26 DIAGNOSIS — E669 Obesity, unspecified: Secondary | ICD-10-CM | POA: Diagnosis present

## 2016-01-26 DIAGNOSIS — Z91018 Allergy to other foods: Secondary | ICD-10-CM

## 2016-01-26 DIAGNOSIS — L8990 Pressure ulcer of unspecified site, unspecified stage: Secondary | ICD-10-CM | POA: Diagnosis present

## 2016-01-26 DIAGNOSIS — Z7984 Long term (current) use of oral hypoglycemic drugs: Secondary | ICD-10-CM | POA: Diagnosis not present

## 2016-01-26 DIAGNOSIS — N390 Urinary tract infection, site not specified: Secondary | ICD-10-CM | POA: Diagnosis present

## 2016-01-26 DIAGNOSIS — E1165 Type 2 diabetes mellitus with hyperglycemia: Secondary | ICD-10-CM | POA: Diagnosis present

## 2016-01-26 DIAGNOSIS — E1169 Type 2 diabetes mellitus with other specified complication: Secondary | ICD-10-CM | POA: Diagnosis not present

## 2016-01-26 DIAGNOSIS — R6521 Severe sepsis with septic shock: Secondary | ICD-10-CM | POA: Diagnosis present

## 2016-01-26 DIAGNOSIS — Z6831 Body mass index (BMI) 31.0-31.9, adult: Secondary | ICD-10-CM | POA: Diagnosis not present

## 2016-01-26 DIAGNOSIS — IMO0002 Reserved for concepts with insufficient information to code with codable children: Secondary | ICD-10-CM

## 2016-01-26 DIAGNOSIS — N359 Urethral stricture, unspecified: Secondary | ICD-10-CM | POA: Diagnosis present

## 2016-01-26 DIAGNOSIS — N289 Disorder of kidney and ureter, unspecified: Secondary | ICD-10-CM | POA: Diagnosis present

## 2016-01-26 DIAGNOSIS — I10 Essential (primary) hypertension: Secondary | ICD-10-CM | POA: Diagnosis present

## 2016-01-26 DIAGNOSIS — E119 Type 2 diabetes mellitus without complications: Secondary | ICD-10-CM | POA: Diagnosis present

## 2016-01-26 DIAGNOSIS — A419 Sepsis, unspecified organism: Secondary | ICD-10-CM | POA: Diagnosis present

## 2016-01-26 DIAGNOSIS — N35912 Unspecified bulbous urethral stricture, male: Secondary | ICD-10-CM | POA: Diagnosis present

## 2016-01-26 DIAGNOSIS — N35919 Unspecified urethral stricture, male, unspecified site: Secondary | ICD-10-CM

## 2016-01-26 DIAGNOSIS — T83518A Infection and inflammatory reaction due to other urinary catheter, initial encounter: Secondary | ICD-10-CM | POA: Diagnosis present

## 2016-01-26 DIAGNOSIS — A4151 Sepsis due to Escherichia coli [E. coli]: Secondary | ICD-10-CM | POA: Diagnosis not present

## 2016-01-26 DIAGNOSIS — R1030 Lower abdominal pain, unspecified: Secondary | ICD-10-CM | POA: Diagnosis present

## 2016-01-26 DIAGNOSIS — N358 Other urethral stricture: Secondary | ICD-10-CM | POA: Diagnosis not present

## 2016-01-26 LAB — URINALYSIS, ROUTINE W REFLEX MICROSCOPIC
Bilirubin Urine: NEGATIVE
Glucose, UA: >1000 mg/dL — AB
Ketones, ur: 15 mg/dL — AB
Nitrite: POSITIVE — AB
Protein, ur: 100 mg/dL — AB
Specific Gravity, Urine: 1.039 — ABNORMAL HIGH (ref 1.005–1.030)
pH: 5.5 (ref 5.0–8.0)

## 2016-01-26 LAB — I-STAT VENOUS BLOOD GAS, ED
Acid-Base Excess: 4 mmol/L — ABNORMAL HIGH (ref 0.0–2.0)
Bicarbonate: 31.7 mmol/L — ABNORMAL HIGH (ref 20.0–28.0)
O2 Saturation: 16 %
Patient temperature: 98.4
TCO2: 33 mmol/L (ref 0–100)
pCO2, Ven: 54 mmHg (ref 44.0–60.0)
pH, Ven: 7.376 (ref 7.250–7.430)
pO2, Ven: 14 mmHg — CL (ref 32.0–45.0)

## 2016-01-26 LAB — CBC WITH DIFFERENTIAL/PLATELET
Basophils Absolute: 0 10*3/uL (ref 0.0–0.1)
Basophils Relative: 0 %
Eosinophils Absolute: 0 10*3/uL (ref 0.0–0.7)
Eosinophils Relative: 0 %
HCT: 51.4 % (ref 39.0–52.0)
Hemoglobin: 18.1 g/dL — ABNORMAL HIGH (ref 13.0–17.0)
Lymphocytes Relative: 4 %
Lymphs Abs: 0.4 10*3/uL — ABNORMAL LOW (ref 0.7–4.0)
MCH: 29.9 pg (ref 26.0–34.0)
MCHC: 35.2 g/dL (ref 30.0–36.0)
MCV: 85 fL (ref 78.0–100.0)
Monocytes Absolute: 0 10*3/uL — ABNORMAL LOW (ref 0.1–1.0)
Monocytes Relative: 0 %
Neutro Abs: 11 10*3/uL — ABNORMAL HIGH (ref 1.7–7.7)
Neutrophils Relative %: 96 %
Platelets: 295 10*3/uL (ref 150–400)
RBC: 6.05 MIL/uL — ABNORMAL HIGH (ref 4.22–5.81)
RDW: 12.1 % (ref 11.5–15.5)
WBC: 11.4 10*3/uL — ABNORMAL HIGH (ref 4.0–10.5)

## 2016-01-26 LAB — COMPREHENSIVE METABOLIC PANEL
ALT: 57 U/L (ref 17–63)
AST: 34 U/L (ref 15–41)
Albumin: 4.8 g/dL (ref 3.5–5.0)
Alkaline Phosphatase: 83 U/L (ref 38–126)
Anion gap: 11 (ref 5–15)
BUN: 17 mg/dL (ref 6–20)
CO2: 28 mmol/L (ref 22–32)
Calcium: 10.2 mg/dL (ref 8.9–10.3)
Chloride: 97 mmol/L — ABNORMAL LOW (ref 101–111)
Creatinine, Ser: 1.18 mg/dL (ref 0.61–1.24)
GFR calc Af Amer: >60 mL/min (ref >60)
GFR calc non Af Amer: >60 mL/min (ref >60)
Glucose, Bld: 330 mg/dL — ABNORMAL HIGH (ref 65–99)
Potassium: 4 mmol/L (ref 3.5–5.1)
Sodium: 136 mmol/L (ref 135–145)
Total Bilirubin: 0.7 mg/dL (ref 0.3–1.2)
Total Protein: 9.6 g/dL — ABNORMAL HIGH (ref 6.5–8.1)

## 2016-01-26 LAB — URINE MICROSCOPIC-ADD ON

## 2016-01-26 LAB — I-STAT CG4 LACTIC ACID, ED: Lactic Acid, Venous: 4.21 mmol/L (ref 0.5–1.9)

## 2016-01-26 LAB — CBG MONITORING, ED
Glucose-Capillary: 370 mg/dL — ABNORMAL HIGH (ref 65–99)
Glucose-Capillary: 305 mg/dL — ABNORMAL HIGH (ref 65–99)

## 2016-01-26 MED ORDER — SODIUM CHLORIDE 0.9 % IV BOLUS (SEPSIS)
1000.0000 mL | Freq: Once | INTRAVENOUS | Status: AC
  Administered 2016-01-26: 1000 mL via INTRAVENOUS

## 2016-01-26 MED ORDER — SODIUM CHLORIDE 0.9 % IV SOLN
INTRAVENOUS | Status: DC
  Filled 2016-01-26: qty 2.5

## 2016-01-26 MED ORDER — ACETAMINOPHEN 500 MG PO TABS
1000.0000 mg | ORAL_TABLET | Freq: Once | ORAL | Status: DC
Start: 2016-01-26 — End: 2016-01-26
  Filled 2016-01-26: qty 2

## 2016-01-26 MED ORDER — IBUPROFEN 800 MG PO TABS
800.0000 mg | ORAL_TABLET | Freq: Once | ORAL | Status: AC
  Administered 2016-01-26: 800 mg via ORAL
  Filled 2016-01-26: qty 1

## 2016-01-26 MED ORDER — DEXTROSE 5 % IV SOLN
1.0000 g | Freq: Once | INTRAVENOUS | Status: AC
  Administered 2016-01-26: 1 g via INTRAVENOUS
  Filled 2016-01-26: qty 10

## 2016-01-26 MED ORDER — HYDROCODONE-ACETAMINOPHEN 5-325 MG PO TABS
1.0000 | ORAL_TABLET | Freq: Once | ORAL | Status: AC
  Administered 2016-01-26: 1 via ORAL
  Filled 2016-01-26: qty 1

## 2016-01-26 MED ORDER — ACETAMINOPHEN 325 MG PO TABS
650.0000 mg | ORAL_TABLET | Freq: Once | ORAL | Status: AC
  Administered 2016-01-26: 650 mg via ORAL
  Filled 2016-01-26: qty 2

## 2016-01-26 NOTE — ED Notes (Signed)
Patient is resting comfortably. 

## 2016-01-26 NOTE — ED Provider Notes (Signed)
[facility] Provider Note   CSN: [id number] Arrival date & time: 01/26/16  1910  By signing my name below, I, Rosario Adie, attest that this documentation has been prepared under the direction and in the presence of Nira Conn, MD. Electronically Signed: Rosario Adie, ED Scribe. 01/26/16. 8:53 PM.  History   Chief Complaint Chief Complaint  Patient presents with  . Urinary Retention  . Hyperglycemia   The history is provided by the patient, medical records and a parent. No language interpreter was used.    HPI Comments: Caleb Sharp is a 30 y.o. 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 He notes associated pressure-like suprapubic abdominal pain, lower back pain, and burning penile pain secondary to his urinary retention as well. Pt reports that around 3am last night (~18 hours ago) that he noticed his foley catheter stopped draining. Pt had this catheter placed by Dr Annabell Howells (Urology) ~27 days ago. At that time he additionally had a prostate biopsy performed which was negative for prostate cancer. He was scheduled for f/u w/ his Urologist recently, however, states that he missed several of his appointments. Pt was again seen in the ED on 01/16/16 (~9 days ago) for a recent catheter dislodgment, which was re-stitched and re-placed without complication. He denies any recent or possible STD exposure. Pt last had sexual intercourse three weeks ago during which he did not use barrier protection. He additionally notes that he has not been taking his rx'd Metformin for his h/o DM at home for the past several days. No h/o prior DKA or admissions related to this. He denies penile discharge, CP, SOB, headache, or any other associated symptoms.   Past Medical History:  Diagnosis Date    . Foley catheter in place   . Gross hematuria   . Type 2 diabetes mellitus (HCC)   . Urinary retention    Patient Active Problem List   Diagnosis Date Noted  . Bulbous urethral stricture 12/31/2015   Past Surgical History:  Procedure Laterality Date  . CYSTOSCOPY N/A 12/31/2015   Procedure: CYSTOSCOPY, URETEROSCOPY, SUPRAPUBIC TUBE PLACEMENT;  Surgeon: Bjorn Pippin, MD;  Location: [facility];  Service: Urology;  Laterality: N/A;  . CYSTOSCOPY WITH FULGERATION N/A 06/19/2015   Procedure: CYSTOSCOPY WITH FULGERATION;  Surgeon: Barron Alvine, MD;  [facility]  Service: Urology;  Laterality: N/A;  . NO PAST SURGERIES      Home Medications    Prior to Admission medications   Medication Sig Start Date End Date Taking? Authorizing Provider  HYDROcodone-acetaminophen (NORCO) 5-325 MG tablet Take 1 tablet by mouth every 6 (six) hours as needed for moderate pain. 12/31/15   Bjorn Pippin, MD  metFORMIN (GLUCOPHAGE) 1000 MG tablet Take 1,000 mg by mouth 2 (two) times daily with a meal.    Historical Provider, MD  phenazopyridine (PYRIDIUM) 200 MG tablet Take 1 tablet (200 mg total) by mouth 3 (three) times daily as needed for pain. Patient not taking: Reported on 12/31/2015 06/19/15   Barron Alvine, MD   Family History No family history on file.  Social History Social History  Substance Use Topics  . Smoking status: Former Smoker    Packs/day: 1.00    Years: 3.00    Types: Cigarettes    Quit date: 12/31/2014  . Smokeless tobacco: Never Used  .  Alcohol use Yes     Comment: occasional   Allergies   Other   Review of Systems Review of Systems  Respiratory: Negative for shortness of breath.   Cardiovascular: Negative for chest pain.  Gastrointestinal: Positive for abdominal pain.  Genitourinary: Positive for difficulty urinating and penile pain. Negative for discharge.  Musculoskeletal: Positive for back pain.  Neurological: Negative for headaches.  All other  systems reviewed and are negative.  Physical Exam Updated Vital Signs BP (!) 166/112   Pulse 119   Temp 98.6 F (37 C) (Oral)   Resp 18   Ht 5\' 7"  (1.702 m)   Wt 213 lb (96.6 kg)   SpO2 100%   BMI 33.36 kg/m   Physical Exam  Constitutional: He is oriented to person, place, and time. He appears well-developed and well-nourished. No distress.  HENT:  Head: Normocephalic and atraumatic.  Right Ear: External ear normal.  Left Ear: External ear normal.  Nose: Nose normal.  Mouth/Throat: Mucous membranes are normal. No trismus in the jaw.  Eyes: Conjunctivae and EOM are normal. No scleral icterus.  Neck: Normal range of motion and phonation normal.  Cardiovascular: Regular rhythm and normal heart sounds.  Tachycardia present.   No murmur heard. Pulmonary/Chest: Effort normal and breath sounds normal. No stridor. No respiratory distress. He has no wheezes. He has no rales.  Abdominal: Soft. He exhibits no distension. There is no tenderness. Hernia confirmed negative in the right inguinal area and confirmed negative in the left inguinal area.  Suprapubic catheter with stitches in place. Area clean without erythema or tenderness.   Genitourinary: Penis normal. Prostate is enlarged. Prostate is not tender. Cremasteric reflex is present. Right testis shows tenderness. Right testis shows no mass and no swelling. Left testis shows no mass, no swelling and no tenderness. Circumcised. No phimosis, paraphimosis, penile erythema or penile tenderness. No discharge found.  Genitourinary Comments: Chaperone present throughout entire exam.   Musculoskeletal: Normal range of motion. He exhibits no edema.  Neurological: He is alert and oriented to person, place, and time.  Skin: He is not diaphoretic.  Psychiatric: He has a normal mood and affect. His behavior is normal.  Vitals reviewed.  ED Treatments / Results  DIAGNOSTIC STUDIES: Oxygen Saturation is 100% on RA, normal by my interpretation.    COORDINATION OF CARE: 8:48 PM-Discussed next steps with pt. Pt verbalized understanding and is agreeable with the plan.   Labs (all labs ordered are listed, but only abnormal results are displayed) Labs Reviewed  URINALYSIS, ROUTINE W REFLEX MICROSCOPIC (NOT AT [facility]) - Abnormal; Notable for the following:       Result Value   APPearance CLOUDY (*)    Specific Gravity, Urine 1.039 (*)    Glucose, UA >1000 (*)    Hgb urine dipstick LARGE (*)    Ketones, ur 15 (*)    Protein, ur 100 (*)    Nitrite POSITIVE (*)    Leukocytes, UA SMALL (*)    All other components within normal limits  URINE MICROSCOPIC-ADD ON - Abnormal; Notable for the following:    Squamous Epithelial / LPF 6-30 (*)    Bacteria, UA MANY (*)    All other components within normal limits  CBG MONITORING, ED - Abnormal; Notable for the following:    Glucose-Capillary 370 (*)    All other components within normal limits   EKG  EKG Interpretation None      Radiology No results found.  Procedures Procedures  CRITICAL CARE  Performed by: Amadeo GarnetPedro Eduardo Cardama Total critical care time: 40 minutes Critical care time was exclusive of separately billable procedures and treating other patients. Critical care was necessary to treat or prevent imminent or life-threatening deterioration. Critical care was time spent personally by me on the following activities: development of treatment plan with patient and/or surrogate as well as nursing, discussions with consultants, evaluation of patient's response to treatment, examination of patient, obtaining history from patient or surrogate, ordering and performing treatments and interventions, ordering and review of laboratory studies, ordering and review of radiographic studies, pulse oximetry and re-evaluation of patient's condition.   Medications Ordered in ED Medications - No data to display  Initial Impression / Assessment and Plan / ED Course  I have reviewed the  triage vital signs and the nursing notes.  Pertinent labs & imaging results that were available during my care of the patient were reviewed by me and considered in my medical decision making (see chart for details).  Suprapubic drain flushed and now draining appropriately.  Clinical Course as of Jan 31 2353  Wynelle LinkSun Jan 26, 2016  2207 Pt noted to be tachycardic. Requested a rectal temperature which is noted to be over 105. Patient had already received Rocephin for urinary tract infection. We'll obtain a rest of the septic labs.  [PC]  2247 Already getting 30 cc/kg IVF and has received IV Abx for urinary source.  Admitted to Hospitalist service for continued management. Lactic Acid, Venous: (!!) 4.21 [PC]    Clinical Course User Index [PC] Nira ConnPedro Eduardo Cardama, MD     Final Clinical Impressions(s) / ED Diagnoses   Final diagnoses:  Sepsis (HCC)   I personally performed the services described in this documentation, which was scribed in my presence. The recorded information has been reviewed and is accurate.          Nira ConnPedro Eduardo Cardama, MD 02/02/16 0001

## 2016-01-26 NOTE — ED Notes (Signed)
Patient went to bathroom and urinated through penis

## 2016-01-26 NOTE — ED Notes (Signed)
Up to BR and voided through penis. This is first time since having S/P inserted on 12/31/15. Due to urinary retention

## 2016-01-26 NOTE — ED Notes (Signed)
Family at bedside. 

## 2016-01-26 NOTE — ED Notes (Signed)
Patient denies pain and is resting comfortably.  

## 2016-01-26 NOTE — ED Notes (Signed)
Bladder scan done per Gaspar GarbeAlfred EMT, 350ml noted in bladder

## 2016-01-26 NOTE — ED Notes (Signed)
ED MD in to see, aware of elevated HR and temp.

## 2016-01-26 NOTE — ED Triage Notes (Signed)
Patient c/o abd pain, urinary retention, he  states that his foley stopped draining around 3am.  CBG on truck 413, BP 180/40, HR 128. No diabetes meds taken for several days

## 2016-01-26 NOTE — ED Notes (Signed)
CBG: 305 

## 2016-01-26 NOTE — Progress Notes (Signed)
Patient will be admitted to Tele bed for sepsis due to UTI. He has received IVF and antibiotics prior to transfer. He is hemodynamically stable with elevated blood pressure. He has sinus tachycardia. LA elevated. ucx and bcx were sent.

## 2016-01-27 ENCOUNTER — Encounter (HOSPITAL_COMMUNITY): Payer: Self-pay | Admitting: *Deleted

## 2016-01-27 ENCOUNTER — Inpatient Hospital Stay (HOSPITAL_COMMUNITY): Payer: BLUE CROSS/BLUE SHIELD

## 2016-01-27 DIAGNOSIS — N390 Urinary tract infection, site not specified: Secondary | ICD-10-CM | POA: Diagnosis present

## 2016-01-27 DIAGNOSIS — E119 Type 2 diabetes mellitus without complications: Secondary | ICD-10-CM | POA: Diagnosis present

## 2016-01-27 DIAGNOSIS — E1169 Type 2 diabetes mellitus with other specified complication: Secondary | ICD-10-CM | POA: Diagnosis present

## 2016-01-27 DIAGNOSIS — R6521 Severe sepsis with septic shock: Secondary | ICD-10-CM

## 2016-01-27 DIAGNOSIS — A419 Sepsis, unspecified organism: Secondary | ICD-10-CM

## 2016-01-27 DIAGNOSIS — E669 Obesity, unspecified: Secondary | ICD-10-CM

## 2016-01-27 DIAGNOSIS — Z9359 Other cystostomy status: Secondary | ICD-10-CM

## 2016-01-27 LAB — COMPREHENSIVE METABOLIC PANEL
ALT: 44 U/L (ref 17–63)
AST: 36 U/L (ref 15–41)
Albumin: 3.1 g/dL — ABNORMAL LOW (ref 3.5–5.0)
Alkaline Phosphatase: 54 U/L (ref 38–126)
Anion gap: 8 (ref 5–15)
BUN: 10 mg/dL (ref 6–20)
CO2: 22 mmol/L (ref 22–32)
Calcium: 8.2 mg/dL — ABNORMAL LOW (ref 8.9–10.3)
Chloride: 107 mmol/L (ref 101–111)
Creatinine, Ser: 1 mg/dL (ref 0.61–1.24)
GFR calc Af Amer: >60 mL/min (ref >60)
GFR calc non Af Amer: >60 mL/min (ref >60)
Glucose, Bld: 272 mg/dL — ABNORMAL HIGH (ref 65–99)
Potassium: 3.5 mmol/L (ref 3.5–5.1)
Sodium: 137 mmol/L (ref 135–145)
Total Bilirubin: 0.8 mg/dL (ref 0.3–1.2)
Total Protein: 6.3 g/dL — ABNORMAL LOW (ref 6.5–8.1)

## 2016-01-27 LAB — MRSA PCR SCREENING: MRSA by PCR: NEGATIVE

## 2016-01-27 LAB — CBC WITH DIFFERENTIAL/PLATELET
Basophils Absolute: 0 10*3/uL (ref 0.0–0.1)
Basophils Relative: 0 %
Eosinophils Absolute: 0 10*3/uL (ref 0.0–0.7)
Eosinophils Relative: 0 %
HCT: 42.3 % (ref 39.0–52.0)
Hemoglobin: 14.5 g/dL (ref 13.0–17.0)
Lymphocytes Relative: 2 %
Lymphs Abs: 0.3 10*3/uL — ABNORMAL LOW (ref 0.7–4.0)
MCH: 29.5 pg (ref 26.0–34.0)
MCHC: 34.3 g/dL (ref 30.0–36.0)
MCV: 86.2 fL (ref 78.0–100.0)
Monocytes Absolute: 0.5 10*3/uL (ref 0.1–1.0)
Monocytes Relative: 2 %
Neutro Abs: 18 10*3/uL — ABNORMAL HIGH (ref 1.7–7.7)
Neutrophils Relative %: 96 %
Platelets: 183 10*3/uL (ref 150–400)
RBC: 4.91 MIL/uL (ref 4.22–5.81)
RDW: 12.1 % (ref 11.5–15.5)
WBC: 18.8 10*3/uL — ABNORMAL HIGH (ref 4.0–10.5)

## 2016-01-27 LAB — GLUCOSE, CAPILLARY
Glucose-Capillary: 268 mg/dL — ABNORMAL HIGH (ref 65–99)
Glucose-Capillary: 235 mg/dL — ABNORMAL HIGH (ref 65–99)
Glucose-Capillary: 212 mg/dL — ABNORMAL HIGH (ref 65–99)
Glucose-Capillary: 154 mg/dL — ABNORMAL HIGH (ref 65–99)

## 2016-01-27 LAB — LACTIC ACID, PLASMA
Lactic Acid, Venous: 2.8 mmol/L (ref 0.5–1.9)
Lactic Acid, Venous: 3.2 mmol/L (ref 0.5–1.9)
Lactic Acid, Venous: 2.1 mmol/L (ref 0.5–1.9)
Lactic Acid, Venous: 1.1 mmol/L (ref 0.5–1.9)

## 2016-01-27 LAB — HIV ANTIBODY (ROUTINE TESTING W REFLEX): HIV Screen 4th Generation wRfx: NONREACTIVE

## 2016-01-27 LAB — BETA-HYDROXYBUTYRIC ACID: Beta-Hydroxybutyric Acid: 0.15 mmol/L (ref 0.05–0.27)

## 2016-01-27 LAB — PHOSPHORUS: Phosphorus: 2.1 mg/dL — ABNORMAL LOW (ref 2.5–4.6)

## 2016-01-27 LAB — MAGNESIUM: Magnesium: 1.1 mg/dL — ABNORMAL LOW (ref 1.7–2.4)

## 2016-01-27 MED ORDER — ALBUTEROL SULFATE (2.5 MG/3ML) 0.083% IN NEBU: 2.5000 mg | INHALATION_SOLUTION | RESPIRATORY_TRACT | Status: DC | PRN

## 2016-01-27 MED ORDER — TRAMADOL HCL 50 MG PO TABS
50.0000 mg | ORAL_TABLET | Freq: Once | ORAL | Status: AC
  Administered 2016-01-27: 50 mg via ORAL
  Filled 2016-01-27: qty 1

## 2016-01-27 MED ORDER — INSULIN DETEMIR 100 UNIT/ML ~~LOC~~ SOLN
5.0000 [IU] | Freq: Every day | SUBCUTANEOUS | Status: DC
  Administered 2016-01-27: 5 [IU] via SUBCUTANEOUS
  Filled 2016-01-27 (×2): qty 0.05

## 2016-01-27 MED ORDER — ONDANSETRON HCL 4 MG/2ML IJ SOLN: 4.0000 mg | Freq: Four times a day (QID) | INTRAMUSCULAR | Status: DC | PRN

## 2016-01-27 MED ORDER — ENOXAPARIN SODIUM 40 MG/0.4ML ~~LOC~~ SOLN
40.0000 mg | Freq: Every day | SUBCUTANEOUS | Status: DC
  Administered 2016-01-27 – 2016-01-29 (×3): 40 mg via SUBCUTANEOUS
  Filled 2016-01-27 (×3): qty 0.4

## 2016-01-27 MED ORDER — ACETAMINOPHEN 325 MG PO TABS
650.0000 mg | ORAL_TABLET | Freq: Four times a day (QID) | ORAL | Status: DC | PRN
  Administered 2016-01-27 – 2016-01-31 (×5): 650 mg via ORAL
  Filled 2016-01-27 (×5): qty 2

## 2016-01-27 MED ORDER — INSULIN ASPART 100 UNIT/ML ~~LOC~~ SOLN
0.0000 [IU] | Freq: Three times a day (TID) | SUBCUTANEOUS | Status: DC
  Administered 2016-01-27 (×2): 3 [IU] via SUBCUTANEOUS
  Administered 2016-01-27: 2 [IU] via SUBCUTANEOUS
  Administered 2016-01-28: 1 [IU] via SUBCUTANEOUS
  Administered 2016-01-28 – 2016-01-29 (×3): 2 [IU] via SUBCUTANEOUS
  Administered 2016-01-29: 1 [IU] via SUBCUTANEOUS
  Administered 2016-01-30: 2 [IU] via SUBCUTANEOUS
  Administered 2016-01-30: 1 [IU] via SUBCUTANEOUS
  Administered 2016-01-30: 2 [IU] via SUBCUTANEOUS
  Administered 2016-01-31: 3 [IU] via SUBCUTANEOUS
  Administered 2016-01-31: 2 [IU] via SUBCUTANEOUS

## 2016-01-27 MED ORDER — ENOXAPARIN SODIUM 40 MG/0.4ML ~~LOC~~ SOLN: 40.0000 mg | SUBCUTANEOUS | Status: DC

## 2016-01-27 MED ORDER — INSULIN DETEMIR 100 UNIT/ML ~~LOC~~ SOLN
9.0000 [IU] | Freq: Every day | SUBCUTANEOUS | Status: DC
  Administered 2016-01-27 – 2016-01-30 (×4): 9 [IU] via SUBCUTANEOUS
  Filled 2016-01-27 (×5): qty 0.09

## 2016-01-27 MED ORDER — SODIUM CHLORIDE 0.9 % IV SOLN
INTRAVENOUS | Status: DC
  Administered 2016-01-27 – 2016-01-31 (×6): via INTRAVENOUS

## 2016-01-27 MED ORDER — INSULIN ASPART 100 UNIT/ML ~~LOC~~ SOLN
0.0000 [IU] | Freq: Every day | SUBCUTANEOUS | Status: DC
  Administered 2016-01-30: 2 [IU] via SUBCUTANEOUS

## 2016-01-27 MED ORDER — ONDANSETRON HCL 4 MG PO TABS: 4.0000 mg | ORAL_TABLET | Freq: Four times a day (QID) | ORAL | Status: DC | PRN

## 2016-01-27 MED ORDER — MAGNESIUM SULFATE 2 GM/50ML IV SOLN
2.0000 g | Freq: Once | INTRAVENOUS | Status: AC
Start: 2016-01-27 — End: 2016-01-27
  Administered 2016-01-27: 2 g via INTRAVENOUS
  Filled 2016-01-27: qty 50

## 2016-01-27 MED ORDER — CEFTRIAXONE SODIUM 2 G IJ SOLR: 2.0000 g | Freq: Once | INTRAMUSCULAR | Status: DC

## 2016-01-27 MED ORDER — ACETAMINOPHEN 650 MG RE SUPP: 650.0000 mg | Freq: Four times a day (QID) | RECTAL | Status: DC | PRN

## 2016-01-27 MED ORDER — DEXTROSE 5 % IV SOLN
1.0000 g | INTRAVENOUS | Status: DC
  Administered 2016-01-27 – 2016-01-30 (×4): 1 g via INTRAVENOUS
  Filled 2016-01-27 (×5): qty 10

## 2016-01-27 NOTE — Consult Note (Addendum)
Consult requested for suprapubic catheter site.  Pt states this was inserted by Dr Wilson SingerWren of the urology service, about a month ago.  He was due for a follow-up appointment in the office but missed it related to the hospital admission.  There are sutures holding the tube in place but they are pulling and creating a large amt of tension against his skin, other sutures have broken.  There is a partial thickness fissure surrounding the insertion site in the abd skin fold, red and moist.  Patient states he is having a large amt pain related to the tube. Foam dressing has been applied surrounding the tube in an attempt to minimize pressure from the faceplate. This is [facility] scope of practice; discussed plan of care with primary team; recommend consult to urology to remove sutures and assess SP cath insertion site and provide further plan of care.   Please re-consult if further assistance is needed.  Thank-you,  Cammie Mcgeeawn Kerrilyn Azbill MSN, RN, CWOCN, [place]elopeWCN-AP, CNS [phone]

## 2016-01-27 NOTE — Consult Note (Signed)
New Consult Note  Requesting Physician: Jordan HawksJennifer Chahn-Yang Choi*  Service Requesting Consult: Internal med  Urology Consult Attending: Annabell HowellsWrenn Reason for Consult:  Urethral stricture  Subjective: Caleb FolksGaston N Stamos is seen in consultation for reasons noted above at the request of [facility]*.  This is a 30 yo patient with a history of Dm, bulbar urethral stricture s/p SP tube (14Fr Bonanno) 12/31/15 by Dr. Annabell HowellsWrenn, who presented with urinary retention from obstructed SP tube with fevers up to 106. He was lost to follow-up. Per his report, he had issues with his cell phone where he could not arrange transportation or call our clinic to reschedule. He reports he was lying at home yesterday when he noticed he had not had any urine output. He had penile pain and was having small amounts of passage of urine per urethra. This brought him to the emergency department. The suprapubic tube was irrigated and began successfully draining. This has happened before per the patient where the tube needs to be manipulated to drain. Additionally there have been issues with the stitches causing discomfort. Wound nurse has seen patient, recommended our review. He has been improving with fluid and antibiotics. Currently his complaints are of pain around the site.    Past Medical History: Past Medical History:  Diagnosis Date  . Foley catheter in place   . Gross hematuria   . Type 2 diabetes mellitus (HCC)   . Urinary retention     Past Surgical History:  Past Surgical History:  Procedure Laterality Date  . CYSTOSCOPY N/A 12/31/2015   Procedure: CYSTOSCOPY, URETEROSCOPY, SUPRAPUBIC TUBE PLACEMENT;  Surgeon: Bjorn PippinJohn Triston Lisanti, MD;  Location: [facility];  Service: Urology;  Laterality: N/A;  . CYSTOSCOPY WITH FULGERATION N/A 06/19/2015   Procedure: CYSTOSCOPY WITH FULGERATION;  Surgeon: Barron Alvineavid Grapey, MD;  [facility];  Service: Urology;  Laterality: N/A;  . NO PAST SURGERIES       Medication: Current Facility-Administered Medications  Medication Dose Route Frequency Provider Last Rate Last Dose  . 0.9 %  sodium chloride infusion   Intravenous Continuous Clydie Braunondell A Smith, MD 100 mL/hr at 01/27/16 0425    . acetaminophen (TYLENOL) tablet 650 mg  650 mg Oral Q6H PRN Clydie Braunondell A Smith, MD       Or  . acetaminophen (TYLENOL) suppository 650 mg  650 mg Rectal Q6H PRN Rondell A Katrinka BlazingSmith, MD      . albuterol (PROVENTIL) (2.5 MG/3ML) 0.083% nebulizer solution 2.5 mg  2.5 mg Nebulization Q2H PRN Rondell A Katrinka BlazingSmith, MD      . cefTRIAXone (ROCEPHIN) 1 g in dextrose 5 % 50 mL IVPB  1 g Intravenous Q24H Rondell A Smith, MD      . enoxaparin (LOVENOX) injection 40 mg  40 mg Subcutaneous Daily Clydie Braunondell A Smith, MD   40 mg at 01/27/16 0909  . insulin aspart (novoLOG) injection 0-5 Units  0-5 Units Subcutaneous QHS Rondell A Smith, MD      . insulin aspart (novoLOG) injection 0-9 Units  0-9 Units Subcutaneous TID WC Clydie Braunondell A Smith, MD   3 Units at 01/27/16 1305  . insulin detemir (LEVEMIR) injection 9 Units  9 Units Subcutaneous QHS Jennifer Chahn-Yang Choi, DO      . ondansetron [facility](ZOFRAN) tablet 4 mg  4 mg Oral Q6H PRN Clydie Braunondell A Smith, MD       Or  . ondansetron (ZOFRAN) injection 4 mg  4 mg Intravenous Q6H PRN Clydie Braunondell A Smith, MD  Allergies: Allergies  Allergen Reactions  . Other Other (See Comments)    Coconut-- n/v     Social History: Social History  Substance Use Topics  . Smoking status: Former Smoker    Packs/day: 1.00    Years: 3.00    Types: Cigarettes    Quit date: 12/31/2014  . Smokeless tobacco: Never Used  . Alcohol use Yes     Comment: occasional    Family History History reviewed. No pertinent family history.  Review of Systems 10 systems were reviewed and are negative except as noted specifically in the HPI.  Objective: Vital signs in last 24 hours: BP 115/87   Pulse (!) 125   Temp 98.7 F (37.1 C)   Resp (!) 30   Ht 5\' 7"  (1.702 m)   Wt  92.1 kg (203 lb)   SpO2 97%   BMI 31.79 kg/m   Intake/Output last 3 shifts: I/O last 3 completed shifts: In: 2268.3 [P.O.:60; I.V.:158.3; IV Piggyback:2050] Out: 825 [Urine:825]  Physical Exam General: NAD, A&O, resting, appropriate HEENT: [employer]/AT, EOMI, MMM Pulmonary: Normal work of breathing Cardiovascular: Tachycardic, perfusing well Abdomen: soft, small lesions surrounding suprapubic catheter likely secondary to pressure from surrounding catheter tubing. Low suspicion for underlying abscess. GU: 14Fr Bonanno SP tube in place draining yellow urine.  Extremities: warm and well perfused Neuro: Appropriate, no focal neurological deficits  Most Recent Labs: Lab Results  Component Value Date   WBC 18.8 (H) 01/27/2016   HGB 14.5 01/27/2016   HCT 42.3 01/27/2016   PLT 183 01/27/2016    Lab Results  Component Value Date   NA 137 01/27/2016   K 3.5 01/27/2016   CL 107 01/27/2016   CO2 22 01/27/2016   BUN 10 01/27/2016   CREATININE 1.00 01/27/2016   CALCIUM 8.2 (L) 01/27/2016   MG 1.1 (L) 01/27/2016   PHOS 2.1 (L) 01/27/2016    Lab Results  Component Value Date   ALKPHOS 54 01/27/2016   BILITOT 0.8 01/27/2016   PROT 6.3 (L) 01/27/2016   ALBUMIN 3.1 (L) 01/27/2016   ALT 44 01/27/2016   AST 36 01/27/2016    No results found for: INR, APTT   Urine Culture: Pending   IMAGING: N/A   Assessment: Patient is a 30 y.o. male with history of bulbar urethral stricture requiring suprapubic catheter placement 12/31/15. He was lost to follow-up. He presented yesterday with acute urinary retention and a non-draining SP tube with fevers up to 106. The tube was irrigated and has been draining well. He has been improving with fluids and antibiotics. Culture pending. Has had repeated catheter problems secondary to catheter type and will need this longterm until definitive management of stricture. Additionally, he needs imaging from above and below stricture prior to  discharge.  Recommendations: 1. IR exchange of suprapubic catheter. Would request they place at least size 16Fr. Order placed, patient made npo after midnight. 2. Once on adequate antibiotics and afebrile for >24 hours, would pursue simultaneous DG cystogram (through suprapubic cath) and DG retrograde urethrogram to access extent of stricture.  3. Wound is likely pressure ulcer from catheter tubing. Little concern for underlying abscess. Should be amenable to routine wound care following cath exchange. 4. Follow-up cultures. Antibiotics per primary team.  Thank you for this consult. Please do not hesitate to contact us with any further questions/concerns.  Buck Mam, MD PGY4 Urology Resident  Chart reviewed, patient interviewed and examined and case discussed with Dr. Judie Grieve.  I concur with the assessment  and recommendations.   CC:  Dr. Noralee StainJennifer Choi.

## 2016-01-27 NOTE — Progress Notes (Signed)
Inpatient Diabetes Program Recommendations  AACE/ADA: New Consensus Statement on Inpatient Glycemic Control (2015)  Target Ranges:  Prepandial:   less than 140 mg/dL      Peak postprandial:   less than 180 mg/dL (1-2 hours)      Critically ill patients:  140 - 180 mg/dL   Results for Marguarite ArbourDIOMI, Kyuss N (MRN [id number]) as of 01/27/2016 11:22  Ref. Range 01/26/2016 19:16 01/26/2016 21:17 01/27/2016 07:28  Glucose-Capillary Latest Ref Range: 65 - 99 mg/dL 409370 (H) 811305 (H) 914235 (H)   Review of Glycemic Control  Diabetes history: DM2 Outpatient Diabetes medications: Metformin 1000 mg BID (per H&P; not listed on home med list) Current orders for Inpatient glycemic control: Levemir 5 units QHS, Novolog 0-9 units TID with meals, Novolog 0-5 units QHS  Inpatient Diabetes Program Recommendations: Insulin - Basal: Please consider increasing Levemir to 9 units QHS. HgbA1C: Please consider ordering an A1C to evaluate glycemic control over the past 2-3 months.  Thanks, Orlando PennerMarie Royale Swamy, RN, MSN, CDE Diabetes Coordinator Inpatient Diabetes Program [phone] (Team Pager from 8am to 5pm)

## 2016-01-27 NOTE — Progress Notes (Signed)
Critical lactic acid 2.8 from 4.21 paged admitting md with results

## 2016-01-27 NOTE — Plan of Care (Signed)
Problem: Skin Integrity: Goal: Risk for impaired skin integrity will decrease Outcome: Progressing Discussed wound openings around suprapubic and placed foam dressing to help protect with some teach back displayed.

## 2016-01-27 NOTE — Progress Notes (Signed)
CRITICAL VALUE ALERT  Critical value received:  Lactic Acid, Venous (2.1)  Date of notification:  01/27/2016  Time of notification:  1506  Critical value read back:Yes.    Nurse who received alert:  Lovie MacadamiaNadine Luetta Piazza  RN  MD notified (1st page):  Dr Alvino Chapelhoi  Time of first page:  1530  MD notified (2nd page):  Time of second page:  Responding MD:  Did not get a call back  Time MD responded:  MD made aware Via Amion

## 2016-01-27 NOTE — Progress Notes (Signed)
PROGRESS NOTE    Caleb Sharp  ZOX:[id number] DOB: [birth year] DOA: 01/26/2016 PCP: Jackie Plum, MD     Brief Narrative:  Caleb Sharp is a 30 y.o. male with medical history significant of DM type II, bullous urethral stricture s/p suprapubic Foley (followed by Dr.Wrenn of Urology); who presented to with complaints of no urine output from Foley for approximately 18 hours. He complained of lower abdominal pressure, low back pain, burning penile sensation. In the emergency department,  patient was seen to be febrile up to 105.55F rectally, heart rates noted as high as 140. Lab work revealed CBC 11.5, glucose 330, lactic acid 4.21. Patient was given over 4 L of normal saline IV fluid and ceftriaxone. Patient's catheter bag was changed out. Transfer to stepdown unit.   Assessment & Plan:   Principal Problem:   Complicated urinary tract infection Active Problems:   Bulbous urethral stricture   Septic shock (HCC)   Diabetes mellitus type 2 in obese (HCC)  Septic shock 2/2 Complicated urinary tract infection, catheter-related, POA -Fever 105.6, tachycardia, tachypenia, leukocytosis, lactic acidosis, low BP -Continue to trend lactic acid  -Blood cultures pending  -Urine culture pending  -GC/Chlamydia probe pending, HIV pending  -IVF  -Continue rocephin   Status post suprapubic catheter 2/2 bulbous urethral stricture -Suprapubic catheter placed by Dr. Annabell Howells 10/31. Seen in ED 11/16 due to stiches coming loose. Consult urology today   Hypomagnesemia -Replace today   Diabetes mellitus type 2, hyperglycemia. -Levemir 9u qhs, Novolog SSI achs   -Check Ha1c    DVT prophylaxis: lovenox Code Status: full Family Communication: mom at bedside Disposition Plan: pending further improvement, back home    Consultants:   Urology  Procedures:   None  Antimicrobials:   Rocephin 11/26 >>     Subjective: Patient complaining of lower pelvic pressure and pain as well as  burning penile sensation. He admits to some dizziness, nausea, but tolerating meals without vomiting. No fevers at home, but did have high fever in ED associated with chills/rigors.   Objective: Vitals:   01/27/16 0400 01/27/16 0500 01/27/16 0600 01/27/16 0804  BP: 113/78 115/87 116/81 127/68  Pulse: (!) 109 (!) 114 (!) 113   Resp: (!) 23 (!) 24 (!) 23 (!) 22  Temp: 98.9 F (37.2 C)   98.2 F (36.8 C)  TempSrc: Rectal     SpO2: 95% 95% 95% 99%  Weight:      Height:        Intake/Output Summary (Last 24 hours) at 01/27/16 1144 Last data filed at 01/27/16 0900  Gross per 24 hour  Intake          2268.33 ml  Output              825 ml  Net          1443.33 ml   Filed Weights   01/26/16 1912 01/26/16 2253  Weight: 96.6 kg (213 lb) 92.1 kg (203 lb)    Examination:  General exam: Appears calm and comfortable  Respiratory system: Clear to auscultation. Respiratory effort normal. Cardiovascular system: S1 & S2 heard, tachycardic, regular rhythm. No JVD, murmurs, rubs, gallops or clicks. No pedal edema. Gastrointestinal system: Abdomen is nondistended, soft and nontender. No organomegaly or masses felt. Normal bowel sounds heard. Gu: suprapubic catheter present, tender to palpation without gross pus or cellulitic changes of skin, catheter draining dark ruddy urine Central nervous system: Alert and oriented. No focal neurological deficits. Extremities: Symmetric 5 x 5 power.  Skin: No rashes, lesions or ulcers Psychiatry: Judgement and insight appear normal. Mood & affect appropriate.   Data Reviewed: I have personally reviewed following labs and imaging studies  CBC:  Recent Labs Lab 01/26/16 2100 01/27/16 0347  WBC 11.4* 18.8*  NEUTROABS 11.0* 18.0*  HGB 18.1* 14.5  HCT 51.4 42.3  MCV 85.0 86.2  PLT 295 183   Basic Metabolic Panel:  Recent Labs Lab 01/26/16 2100 01/27/16 0347  NA 136 137  K 4.0 3.5  CL 97* 107  CO2 28 22  GLUCOSE 330* 272*  BUN 17 10    CREATININE 1.18 1.00  CALCIUM 10.2 8.2*  MG  --  1.1*  PHOS  --  2.1*   GFR: Estimated Creatinine Clearance: 119 mL/min (by C-G formula based on SCr of 1 mg/dL). Liver Function Tests:  Recent Labs Lab 01/26/16 2100 01/27/16 0347  AST 34 36  ALT 57 44  ALKPHOS 83 54  BILITOT 0.7 0.8  PROT 9.6* 6.3*  ALBUMIN 4.8 3.1*   No results for input(s): LIPASE, AMYLASE in the last 168 hours. No results for input(s): AMMONIA in the last 168 hours. Coagulation Profile: No results for input(s): INR, PROTIME in the last 168 hours. Cardiac Enzymes: No results for input(s): CKTOTAL, CKMB, CKMBINDEX, TROPONINI in the last 168 hours. BNP (last 3 results) No results for input(s): PROBNP in the last 8760 hours. HbA1C: No results for input(s): HGBA1C in the last 72 hours. CBG:  Recent Labs Lab 01/26/16 1916 01/26/16 2117 01/27/16 0148 01/27/16 0728  GLUCAP 370* 305* 268* 235*   Lipid Profile: No results for input(s): CHOL, HDL, LDLCALC, TRIG, CHOLHDL, LDLDIRECT in the last 72 hours. Thyroid Function Tests: No results for input(s): TSH, T4TOTAL, FREET4, T3FREE, THYROIDAB in the last 72 hours. Anemia Panel: No results for input(s): VITAMINB12, FOLATE, FERRITIN, TIBC, IRON, RETICCTPCT in the last 72 hours. Sepsis Labs:  Recent Labs Lab 01/26/16 2227 01/27/16 0347 01/27/16 0711  LATICACIDVEN 4.21* 2.8* 3.2*    Recent Results (from the past 240 hour(s))  Blood Culture (routine x 2)     Status: None (Preliminary result)   Collection Time: 01/26/16 10:20 PM  Result Value Ref Range Status   Specimen Description BLOOD LEFT HAND  Final   Special Requests BOTTLES DRAWN AEROBIC AND ANAEROBIC 5CC  Final   Culture PENDING  Incomplete   Report Status PENDING  Incomplete  Blood Culture (routine x 2)     Status: None (Preliminary result)   Collection Time: 01/26/16 10:45 PM  Result Value Ref Range Status   Specimen Description BLOOD RIGHT HAND  Final   Special Requests BOTTLES DRAWN  AEROBIC AND ANAEROBIC 5CC  Final   Culture PENDING  Incomplete   Report Status PENDING  Incomplete  MRSA PCR Screening     Status: None   Collection Time: 01/27/16  1:55 AM  Result Value Ref Range Status   MRSA by PCR NEGATIVE NEGATIVE Final    Comment:        The GeneXpert MRSA Assay (FDA approved for NASAL specimens only), is one component of a comprehensive MRSA colonization surveillance program. It is not intended to diagnose MRSA infection nor to guide or monitor treatment for MRSA infections.        Radiology Studies: Portable Chest 1 View  Result Date: 01/27/2016 CLINICAL DATA:  Sepsis. EXAM: PORTABLE CHEST 1 VIEW COMPARISON:  11/06/2014. FINDINGS: Mediastinum and hilar structures are normal. Heart size stable. Low lung volumes. No pleural effusion or pneumothorax. IMPRESSION:  Low lung volumes.  No focal infiltrate noted. Electronically Signed   By: Maisie Fushomas  Register   On: 01/27/2016 07:15   Dg Abd Portable 1v  Result Date: 01/27/2016 CLINICAL DATA:  Sepsis. EXAM: PORTABLE ABDOMEN - 1 VIEW COMPARISON:  CT 06/18/2015. FINDINGS: Catheter is noted overlying the pelvis. Soft tissue structures are unremarkable. No bowel distention or free air. No acute bony abnormality. IMPRESSION: No acute abnormality.  No bowel distention. Electronically Signed   By: Maisie Fushomas  Register   On: 01/27/2016 07:14      Scheduled Meds: . cefTRIAXone (ROCEPHIN)  IV  1 g Intravenous Q24H  . enoxaparin (LOVENOX) injection  40 mg Subcutaneous Daily  . insulin aspart  0-5 Units Subcutaneous QHS  . insulin aspart  0-9 Units Subcutaneous TID WC  . insulin detemir  9 Units Subcutaneous QHS   Continuous Infusions: . sodium chloride 100 mL/hr at 01/27/16 0425     LOS: 1 day    Time spent: 40 minutes   Noralee StainJennifer Hershell Brandl, DO [facility] [email or web address] Password [email or web address] 01/27/2016, 11:44 AM

## 2016-01-27 NOTE — Progress Notes (Addendum)
CRITICAL VALUE ALERT  Critical value received:  Lactic Acid, Venous (3.2)  Date of notification:  01/27/2016  Time of notification:  0813  Critical value read back:Yes.    Nurse who received alert:  Lovie MacadamiaNadine Melaina Howerton RN  MD notified (1st page):  Dr Alvino Chapelhoi  Time of first page:  [phone] Notified Via Amion  MD notified (2nd page):  Time of second page:  Responding MD:  Did not get a call back, but MD place another or for lactic Acid  Time MD responded:  This was placed for 1400

## 2016-01-27 NOTE — Progress Notes (Signed)
Patient refused lab draw for repeat lactic acid.

## 2016-01-27 NOTE — H&P (Signed)
History and Physical    Caleb FolksGaston N Menges ZOX:[id number] DOB: 1985/07/23 DOA: 01/26/2016  Referring MD/NP/PA: Dr. Theo DillsHiatt PCP: Jackie PlumSEI-BONSU,GEORGE, MD  [facility]  Chief Complaint: Inability to urinate  HPI: Caleb Sharp is a 30 y.o. male with medical history significant of DM type II, bullous urethral stricture s/p suprapubic Foley (followed by Dr.Wrenn of Urology); who presented to with complaints of no urine output from Foley for approximately 18 hours. Associated symptoms include lower abdominal pressure, low back pain, burning penile sensation. He had come into the emergency department approximately 9 days ago after accidentally dislodging the suprapubic catheter to have it replaced. Patient notes that he was supposed to follow-up 15th with his urologist to have the suprapubic catheter changed out. Patient notes unprotected sexual  intercourse within the last 3 weeks. Patient is on metformin at home. Currently denies any chest pain, shortness of breath, nausea, or vomiting.  ED Course: On admission to the emergency department patient was seen to be febrile up to 105.36F rectally, heart rates noted as high as 140. Lab work revealed CBC 11.5, glucose 330, lactic acid 4.21. Patient was given over 4 L of normal saline IV fluid and ceftriaxone. Patient's catheter bag was changed out. Transfer to stepdown unit.  Review of Systems: As per HPI otherwise 10 point review of systems negative.   Past Medical History:  Diagnosis Date  . Foley catheter in place   . Gross hematuria   . Type 2 diabetes mellitus (HCC)   . Urinary retention     Past Surgical History:  Procedure Laterality Date  . CYSTOSCOPY N/A 12/31/2015   Procedure: CYSTOSCOPY, URETEROSCOPY, SUPRAPUBIC TUBE PLACEMENT;  Surgeon: Bjorn PippinJohn Wrenn, MD;  Location: [facility];  Service: Urology;  Laterality: N/A;  . CYSTOSCOPY WITH FULGERATION N/A 06/19/2015   Procedure: CYSTOSCOPY WITH FULGERATION;  Surgeon: Barron Alvineavid Grapey,  MD;  [facility];  Service: Urology;  Laterality: N/A;  . NO PAST SURGERIES       reports that he quit smoking about 12 months ago. His smoking use included Cigarettes. He has a 3.00 pack-year smoking history. He has never used smokeless tobacco. He reports that he drinks alcohol. He reports that he does not use drugs.  Allergies  Allergen Reactions  . Other     Coconut-- n/v     History reviewed. No pertinent family history.  Prior to Admission medications   Medication Sig Start Date End Date Taking? Authorizing Provider  HYDROcodone-acetaminophen (NORCO) 5-325 MG tablet Take 1 tablet by mouth every 6 (six) hours as needed for moderate pain. 12/31/15   Bjorn PippinJohn Wrenn, MD  metFORMIN (GLUCOPHAGE) 1000 MG tablet Take 1,000 mg by mouth 2 (two) times daily with a meal.    Historical Provider, MD  phenazopyridine (PYRIDIUM) 200 MG tablet Take 1 tablet (200 mg total) by mouth 3 (three) times daily as needed for pain. Patient not taking: Reported on 12/31/2015 06/19/15   Barron Alvineavid Grapey, MD    Physical Exam:   Constitutional: Young male who appears acutely sick, but nontoxic able to follow commands.  Vitals:   01/27/16 0041 01/27/16 0100 01/27/16 0156 01/27/16 0200  BP:  121/71 122/82   Pulse:  119  (!) 117  Resp:  26 (!) 21 19  Temp: 101.4 F (38.6 C)     TempSrc: Rectal     SpO2:  97%  100%  Weight:      Height:       Eyes: PERRL, lids  and conjunctivae normal ENMT: Mucous membranes are moist. Posterior pharynx clear of any exudate or lesions. Normal dentition.  Neck: normal, supple, no masses, no thyromegaly Respiratory: clear to auscultation bilaterally, no wheezing, no crackles. Normal respiratory effort. No accessory muscle use.  Cardiovascular: Tachycardic, no murmurs / rubs / gallops. No extremity edema. 2+ pedal pulses. No carotid bruits.  Abdomen: No CVA tenderness. No abdomen tenderness, no masses palpated. No hepatosplenomegaly. Bowel sounds positive.  Suprapubic catheter in place, but open wound near.  Musculoskeletal: no clubbing / cyanosis. No joint deformity upper and lower extremities. Good ROM, no contractures. Normal muscle tone.  Skin: Wound near superpubic catheter. Patient remains diaphoretic. Neurologic: CN 2-12 grossly intact. Sensation intact, DTR normal. Strength 5/5 in all 4.  Psychiatric: Normal judgment and insight. Alert and oriented x 3. Normal mood.     Labs on Admission: I have personally reviewed following labs and imaging studies  CBC:  Recent Labs Lab 01/26/16 2100  WBC 11.4*  NEUTROABS 11.0*  HGB 18.1*  HCT 51.4  MCV 85.0  PLT 295   Basic Metabolic Panel:  Recent Labs Lab 01/26/16 2100  NA 136  K 4.0  CL 97*  CO2 28  GLUCOSE 330*  BUN 17  CREATININE 1.18  CALCIUM 10.2   GFR: Estimated Creatinine Clearance: 100.8 mL/min (by C-G formula based on SCr of 1.18 mg/dL). Liver Function Tests:  Recent Labs Lab 01/26/16 2100  AST 34  ALT 57  ALKPHOS 83  BILITOT 0.7  PROT 9.6*  ALBUMIN 4.8   No results for input(s): LIPASE, AMYLASE in the last 168 hours. No results for input(s): AMMONIA in the last 168 hours. Coagulation Profile: No results for input(s): INR, PROTIME in the last 168 hours. Cardiac Enzymes: No results for input(s): CKTOTAL, CKMB, CKMBINDEX, TROPONINI in the last 168 hours. BNP (last 3 results) No results for input(s): PROBNP in the last 8760 hours. HbA1C: No results for input(s): HGBA1C in the last 72 hours. CBG:  Recent Labs Lab 01/26/16 1916 01/26/16 2117  GLUCAP 370* 305*   Lipid Profile: No results for input(s): CHOL, HDL, LDLCALC, TRIG, CHOLHDL, LDLDIRECT in the last 72 hours. Thyroid Function Tests: No results for input(s): TSH, T4TOTAL, FREET4, T3FREE, THYROIDAB in the last 72 hours. Anemia Panel: No results for input(s): VITAMINB12, FOLATE, FERRITIN, TIBC, IRON, RETICCTPCT in the last 72 hours. Urine analysis:    Component Value Date/Time   COLORURINE  YELLOW 01/26/2016 1950   APPEARANCEUR CLOUDY (A) 01/26/2016 1950   LABSPEC 1.039 (H) 01/26/2016 1950   PHURINE 5.5 01/26/2016 1950   GLUCOSEU >1000 (A) 01/26/2016 1950   HGBUR LARGE (A) 01/26/2016 1950   BILIRUBINUR NEGATIVE 01/26/2016 1950   KETONESUR 15 (A) 01/26/2016 1950   PROTEINUR 100 (A) 01/26/2016 1950   NITRITE POSITIVE (A) 01/26/2016 1950   LEUKOCYTESUR SMALL (A) 01/26/2016 1950   Sepsis Labs: No results found for this or any previous visit (from the past 240 hour(s)).   Radiological Exams on Admission: No results found.  EKG: Independently reviewed. Sinus tachycardia  Assessment/Plan Septic shock 2/2 Complicated urinary tract infection: Acute. Patient with rectal fever up to 105.53F, WBC 11.4, lactic acid 4.21, and UA+. Patient gives no recent unprotected sexual intercourse. - Admit to stepdown - Sepsis protocol  - Follow-up urine and blood cultures( GC probe pending) - Continue empiric antibiotics of ceftriaxone - Trend lactic acid levels - Tylenol Prn fever  Status post suprapubic catheter 2/2 bulbous urethral stricture - May warrant consultation to Dr. Annabell HowellsWrenn or  on-call urology  Diabetes mellitus type 2, hyperglycemia: Patient's blood glucose on admission 330 with no anion gap and normal beta hydroxybutyrate acid level. - Hypoglycemic protocols - Carb modified diet  - Levemir 5 units subcutaneous at bedtime - CBG every before meals and at bedtime with sensitive SSI - May want to check hemoglobin A1c  Renal insufficiency: Mild. Patient's baseline creatinine 0.89, however patient presents with creatinine elevated to 1.18. - IV fluids of normal saline at 100 ml/hr overnight  DVT prophylaxis: Lovenox Code Status: Full Family Communication: Discussed the plan of care with the patient.  Disposition Plan: Likely discharge home once Consults called: none Admission status: Inpatient   Clydie Braun MD [facility] Pager [phone]  If 7PM-7AM,  please contact night-coverage [email or web address] Password [email or web address]  01/27/2016, 3:03 AM

## 2016-01-28 LAB — BASIC METABOLIC PANEL
Anion gap: 7 (ref 5–15)
BUN: 8 mg/dL (ref 6–20)
CO2: 25 mmol/L (ref 22–32)
Calcium: 8.6 mg/dL — ABNORMAL LOW (ref 8.9–10.3)
Chloride: 105 mmol/L (ref 101–111)
Creatinine, Ser: 0.93 mg/dL (ref 0.61–1.24)
GFR calc Af Amer: >60 mL/min (ref >60)
GFR calc non Af Amer: >60 mL/min (ref >60)
Glucose, Bld: 130 mg/dL — ABNORMAL HIGH (ref 65–99)
Potassium: 4.3 mmol/L (ref 3.5–5.1)
Sodium: 137 mmol/L (ref 135–145)

## 2016-01-28 LAB — CBC WITH DIFFERENTIAL/PLATELET
Basophils Absolute: 0 10*3/uL (ref 0.0–0.1)
Basophils Relative: 0 %
Eosinophils Absolute: 0.1 10*3/uL (ref 0.0–0.7)
Eosinophils Relative: 0 %
HCT: 42.2 % (ref 39.0–52.0)
Hemoglobin: 14.3 g/dL (ref 13.0–17.0)
Lymphocytes Relative: 6 %
Lymphs Abs: 1.1 10*3/uL (ref 0.7–4.0)
MCH: 29.9 pg (ref 26.0–34.0)
MCHC: 33.9 g/dL (ref 30.0–36.0)
MCV: 88.1 fL (ref 78.0–100.0)
Monocytes Absolute: 1.7 10*3/uL — ABNORMAL HIGH (ref 0.1–1.0)
Monocytes Relative: 10 %
Neutro Abs: 15.2 10*3/uL — ABNORMAL HIGH (ref 1.7–7.7)
Neutrophils Relative %: 84 %
Platelets: 174 10*3/uL (ref 150–400)
RBC: 4.79 MIL/uL (ref 4.22–5.81)
RDW: 12.8 % (ref 11.5–15.5)
WBC: 18.1 10*3/uL — ABNORMAL HIGH (ref 4.0–10.5)

## 2016-01-28 LAB — GLUCOSE, CAPILLARY
Glucose-Capillary: 124 mg/dL — ABNORMAL HIGH (ref 65–99)
Glucose-Capillary: 183 mg/dL — ABNORMAL HIGH (ref 65–99)
Glucose-Capillary: 167 mg/dL — ABNORMAL HIGH (ref 65–99)
Glucose-Capillary: 171 mg/dL — ABNORMAL HIGH (ref 65–99)

## 2016-01-28 LAB — HEMOGLOBIN A1C
Hgb A1c MFr Bld: 12.6 % — ABNORMAL HIGH (ref 4.8–5.6)
Mean Plasma Glucose: 315 mg/dL

## 2016-01-28 LAB — MAGNESIUM: Magnesium: 1.9 mg/dL (ref 1.7–2.4)

## 2016-01-28 MED ORDER — GUAIFENESIN 100 MG/5ML PO SOLN
5.0000 mL | ORAL | Status: DC | PRN
  Administered 2016-01-29: 100 mg via ORAL
  Filled 2016-01-28: qty 5

## 2016-01-28 MED ORDER — LIVING WELL WITH DIABETES BOOK
Freq: Once | Status: AC
  Administered 2016-01-28: 1
  Filled 2016-01-28: qty 1

## 2016-01-28 MED ORDER — TRAMADOL HCL 50 MG PO TABS
50.0000 mg | ORAL_TABLET | Freq: Four times a day (QID) | ORAL | Status: DC | PRN
  Administered 2016-01-28 – 2016-01-31 (×3): 50 mg via ORAL
  Filled 2016-01-28 (×3): qty 1

## 2016-01-28 MED ORDER — BENZONATATE 100 MG PO CAPS
100.0000 mg | ORAL_CAPSULE | Freq: Two times a day (BID) | ORAL | Status: DC | PRN
  Administered 2016-01-28 – 2016-01-29 (×2): 100 mg via ORAL
  Filled 2016-01-28 (×2): qty 1

## 2016-01-28 NOTE — Progress Notes (Signed)
Inpatient Diabetes Program Recommendations  AACE/ADA: New Consensus Statement on Inpatient Glycemic Control (2015)  Target Ranges:  Prepandial:   less than 140 mg/dL      Peak postprandial:   less than 180 mg/dL (1-2 hours)      Critically ill patients:  140 - 180 mg/dL   Lab Results  Component Value Date   GLUCAP 124 (H) 01/28/2016   HGBA1C 12.6 (H) 01/27/2016    Review of Glycemic Control  Results for Caleb Sharp, Caleb Sharp (MRN [id number]) as of 01/28/2016 08:48  Ref. Range 01/27/2016 01:48 01/27/2016 07:28 01/27/2016 11:48 01/27/2016 18:28 01/28/2016 08:06  Glucose-Capillary Latest Ref Range: 65 - 99 mg/dL 782268 (H) 956235 (H) 213212 (H) 154 (H) 124 (H)    Diabetes history: Type 2 Outpatient Diabetes medications: Metformin 1000 mg BID (per H&P; not listed on home med list) Current orders for Inpatient glycemic control: Novolog moderate correction 0-9 units tid, 0-5 units qhs, Levemir 9 units qhs  Inpatient Diabetes Program Recommendations: Greatly improved CBG. Agree with current medications for blood sugar management.   Susette RacerJulie Eirene Rather, RN, BA, MHA, CDE Diabetes Coordinator Inpatient Diabetes Program  [phone] (Team Pager) [phone] [facility] Office) 01/28/2016 8:50 AM

## 2016-01-28 NOTE — Progress Notes (Signed)
Inpatient Diabetes Program Recommendations  AACE/ADA: New Consensus Statement on Inpatient Glycemic Control (2015)  Target Ranges:  Prepandial:   less than 140 mg/dL      Peak postprandial:   less than 180 mg/dL (1-2 hours)      Critically ill patients:  140 - 180 mg/dL   Lab Results  Component Value Date   GLUCAP 124 (H) 01/28/2016   HGBA1C 12.6 (H) 01/27/2016    Review of Glycemic Control  Results for Caleb, Sharp (MRN [id number]) as of 01/28/2016 09:55  Ref. Range 01/27/2016 01:48 01/27/2016 07:28 01/27/2016 11:48 01/27/2016 18:28 01/28/2016 08:06  Glucose-Capillary Latest Ref Range: 65 - 99 mg/dL 268 (H) 235 (H) 212 (H) 154 (H) 124 (H)   Met with patient at the bedside regarding his blood sugars and diabetes control.  Although he has Metformin ordered, he has not been taking it because it causes him "to run to the bathroom". He admits to drinking regular sodas, fruit punch and skipping meals. He does not check his blood sugars. He works 3pm to [employer].   I have reviewed the A1C of 12.6%, discussed the significance of it, and the long term complications of having such a high sugar. I have asked him to take his medications as ordered, try to eat three meals a day, schedule a follow up appointment with his MD (he hasn't been in about a year) and check CBG 1x/day and write it down.  He tells me he has a meter and strips at home.   The patient has given to his grandmother in the past, both by syringe and insulin pen.  If the patient is going to go home on insulin, please order the insulin pen teaching kit and have him give himself his insulin at every opportunity.  Please have patient watch the patient education videos [phone] while he is inpatient. I have ordered the Living Well with Diabetes book for this patient.  Gentry Fitz, RN, BA, MHA, CDE Diabetes Coordinator Inpatient Diabetes Program  [phone] (Team Pager) [phone] ([place]) 01/28/2016 10:12 AM

## 2016-01-28 NOTE — Progress Notes (Addendum)
PROGRESS NOTE    Caleb Sharp  AVW:[id number] DOB: 1986-01-24 DOA: 01/26/2016 PCP: Jackie PlumSEI-BONSU,GEORGE, MD     Brief Narrative:  Caleb Sharp is a 30 y.o. male with medical history significant of DM type II, bullous urethral stricture s/p suprapubic Foley on 10/31 (followed by Dr.Wrenn of Urology) who presented to with complaints of no urine output from Foley for approximately 18 hours. He complained of lower abdominal pressure, low back pain, burning penile sensation. In the emergency department,  patient was seen to be febrile up to 105.110F rectally, heart rates noted as high as 140, with low BP. Lab work revealed CBC 11.5, glucose 330, lactic acid 4.21. Patient was given over 4 L of normal saline IV fluid and ceftriaxone. Patient's catheter bag was changed out. He was admitted to stepdown unit for septic shock secondary to complicated catheter related UTI.   Assessment & Plan:   Principal Problem:   Complicated urinary tract infection Active Problems:   Bulbous urethral stricture   Septic shock (HCC)   Diabetes mellitus type 2 in obese (HCC)  Septic shock 2/2 complicated urinary tract infection, catheter-related, POA -Fever 105.6, tachycardia, tachypenia, leukocytosis, lactic acidosis, low BP, improving but still febrile 101.1 with tachycardia and leukocytosis. Lactic acidosis resolved.  -Blood cultures pending  -Urine culture Gram neg rod, speciation and MIC pending  -GC/Chlamydia probe pending, HIV negative  -IVF  -Continue rocephin   Status post suprapubic catheter 2/2 bulbous urethral stricture -Suprapubic catheter placed by Dr. Annabell HowellsWrenn 10/31. Seen in ED 11/16 due to stiches coming loose. Has not followed up with urology outpatient as instructed.  -Urology consulted; planning for IR exchange of suprapubic catheter (postponed until right size supply available). Once afebrile 24 hours, planning for cystogram and urethrogram.   Diabetes mellitus type 2, uncontrolled -Ha1c  12.6 -Levemir 9u qhs, Novolog SSI achs    Dry cough -Supportive care   DVT prophylaxis: lovenox Code Status: full Family Communication: girlfriend at bedside  Disposition Plan: pending further improvement, anticipate discharge back home    Consultants:   Urology  Procedures:   None  Antimicrobials:   Rocephin 11/26 >>     Subjective: Patient's symptoms of irritation around catheter site is still present, esp with movement. Fever last night. Complaining of dry cough. Draining urine via suprapubic cath.   Objective: Vitals:   01/27/16 2000 01/28/16 0000 01/28/16 0400 01/28/16 0808  BP: 123/84 113/88 130/78   Pulse: (!) 117 (!) 102 (!) 113   Resp: (!) 22 (!) 22 (!) 23   Temp:  98.7 F (37.1 C) 99.3 F (37.4 C) 98.6 F (37 C)  TempSrc:  Oral Oral Oral  SpO2: 95% 94% 97%   Weight:      Height:        Intake/Output Summary (Last 24 hours) at 01/28/16 0918 Last data filed at 01/28/16 0820  Gross per 24 hour  Intake             2350 ml  Output             2450 ml  Net             -100 ml   Filed Weights   01/26/16 1912 01/26/16 2253  Weight: 96.6 kg (213 lb) 92.1 kg (203 lb)    Examination:  General exam: Appears calm and comfortable  Respiratory system: Clear to auscultation. Respiratory effort normal. Cardiovascular system: S1 & S2 heard, tachycardic, regular rhythm. No JVD, murmurs, rubs, gallops or clicks. No  pedal edema. Gastrointestinal system: Abdomen is nondistended, soft and nontender. No organomegaly or masses felt. Normal bowel sounds heard. Gu: suprapubic catheter present, tender to palpation without gross pus or cellulitic changes of skin, catheter draining clear urine Central nervous system: Alert and oriented. No focal neurological deficits. Extremities: Symmetric 5 x 5 power. Skin: No rashes, lesions or ulcers Psychiatry: Judgement and insight appear normal. Mood & affect appropriate.   Data Reviewed: I have personally reviewed following  labs and imaging studies  CBC:  Recent Labs Lab 01/26/16 2100 01/27/16 0347 01/28/16 0234  WBC 11.4* 18.8* 18.1*  NEUTROABS 11.0* 18.0* 15.2*  HGB 18.1* 14.5 14.3  HCT 51.4 42.3 42.2  MCV 85.0 86.2 88.1  PLT 295 183 174   Basic Metabolic Panel:  Recent Labs Lab 01/26/16 2100 01/27/16 0347 01/28/16 0234  NA 136 137 137  K 4.0 3.5 4.3  CL 97* 107 105  CO2 28 22 25   GLUCOSE 330* 272* 130*  BUN 17 10 8   CREATININE 1.18 1.00 0.93  CALCIUM 10.2 8.2* 8.6*  MG  --  1.1* 1.9  PHOS  --  2.1*  --    GFR: Estimated Creatinine Clearance: 128 mL/min (by C-G formula based on SCr of 0.93 mg/dL). Liver Function Tests:  Recent Labs Lab 01/26/16 2100 01/27/16 0347  AST 34 36  ALT 57 44  ALKPHOS 83 54  BILITOT 0.7 0.8  PROT 9.6* 6.3*  ALBUMIN 4.8 3.1*   No results for input(s): LIPASE, AMYLASE in the last 168 hours. No results for input(s): AMMONIA in the last 168 hours. Coagulation Profile: No results for input(s): INR, PROTIME in the last 168 hours. Cardiac Enzymes: No results for input(s): CKTOTAL, CKMB, CKMBINDEX, TROPONINI in the last 168 hours. BNP (last 3 results) No results for input(s): PROBNP in the last 8760 hours. HbA1C:  Recent Labs  01/27/16 1424  HGBA1C 12.6*   CBG:  Recent Labs Lab 01/27/16 0148 01/27/16 0728 01/27/16 1148 01/27/16 1828 01/28/16 0806  GLUCAP 268* 235* 212* 154* 124*   Lipid Profile: No results for input(s): CHOL, HDL, LDLCALC, TRIG, CHOLHDL, LDLDIRECT in the last 72 hours. Thyroid Function Tests: No results for input(s): TSH, T4TOTAL, FREET4, T3FREE, THYROIDAB in the last 72 hours. Anemia Panel: No results for input(s): VITAMINB12, FOLATE, FERRITIN, TIBC, IRON, RETICCTPCT in the last 72 hours. Sepsis Labs:  Recent Labs Lab 01/27/16 0347 01/27/16 0711 01/27/16 1424 01/27/16 2307  LATICACIDVEN 2.8* 3.2* 2.1* 1.1    Recent Results (from the past 240 hour(s))  Urine culture     Status: Abnormal (Preliminary result)    Collection Time: 01/26/16  7:50 PM  Result Value Ref Range Status   Specimen Description URINE, RANDOM  Final   Special Requests NONE  Final   Culture >=100,000 COLONIES/mL GRAM NEGATIVE RODS (A)  Final   Report Status PENDING  Incomplete  Blood Culture (routine x 2)     Status: None (Preliminary result)   Collection Time: 01/26/16 10:20 PM  Result Value Ref Range Status   Specimen Description BLOOD LEFT HAND  Final   Special Requests BOTTLES DRAWN AEROBIC AND ANAEROBIC 5CC  Final   Culture PENDING  Incomplete   Report Status PENDING  Incomplete  Blood Culture (routine x 2)     Status: None (Preliminary result)   Collection Time: 01/26/16 10:45 PM  Result Value Ref Range Status   Specimen Description BLOOD RIGHT HAND  Final   Special Requests BOTTLES DRAWN AEROBIC AND ANAEROBIC 5CC  Final  Culture PENDING  Incomplete   Report Status PENDING  Incomplete  MRSA PCR Screening     Status: None   Collection Time: 01/27/16  1:55 AM  Result Value Ref Range Status   MRSA by PCR NEGATIVE NEGATIVE Final    Comment:        The GeneXpert MRSA Assay (FDA approved for NASAL specimens only), is one component of a comprehensive MRSA colonization surveillance program. It is not intended to diagnose MRSA infection nor to guide or monitor treatment for MRSA infections.        Radiology Studies: Portable Chest 1 View  Result Date: 01/27/2016 CLINICAL DATA:  Sepsis. EXAM: PORTABLE CHEST 1 VIEW COMPARISON:  11/06/2014. FINDINGS: Mediastinum and hilar structures are normal. Heart size stable. Low lung volumes. No pleural effusion or pneumothorax. IMPRESSION: Low lung volumes.  No focal infiltrate noted. Electronically Signed   By: Maisie Fus  Register   On: 01/27/2016 07:15   Dg Abd Portable 1v  Result Date: 01/27/2016 CLINICAL DATA:  Sepsis. EXAM: PORTABLE ABDOMEN - 1 VIEW COMPARISON:  CT 06/18/2015. FINDINGS: Catheter is noted overlying the pelvis. Soft tissue structures are unremarkable.  No bowel distention or free air. No acute bony abnormality. IMPRESSION: No acute abnormality.  No bowel distention. Electronically Signed   By: Maisie Fus  Register   On: 01/27/2016 07:14      Scheduled Meds: . cefTRIAXone (ROCEPHIN)  IV  1 g Intravenous Q24H  . enoxaparin (LOVENOX) injection  40 mg Subcutaneous Daily  . insulin aspart  0-5 Units Subcutaneous QHS  . insulin aspart  0-9 Units Subcutaneous TID WC  . insulin detemir  9 Units Subcutaneous QHS   Continuous Infusions: . sodium chloride 100 mL/hr at 01/28/16 0359     LOS: 2 days    Time spent: 30 minutes   Noralee Stain, DO [facility] [email or web address] Password [email or web address] 01/28/2016, 9:18 AM

## 2016-01-29 DIAGNOSIS — A4151 Sepsis due to Escherichia coli [E. coli]: Secondary | ICD-10-CM

## 2016-01-29 DIAGNOSIS — E669 Obesity, unspecified: Secondary | ICD-10-CM

## 2016-01-29 DIAGNOSIS — E1169 Type 2 diabetes mellitus with other specified complication: Secondary | ICD-10-CM

## 2016-01-29 DIAGNOSIS — N358 Other urethral stricture: Secondary | ICD-10-CM

## 2016-01-29 DIAGNOSIS — N390 Urinary tract infection, site not specified: Secondary | ICD-10-CM

## 2016-01-29 LAB — BASIC METABOLIC PANEL
Anion gap: 10 (ref 5–15)
BUN: 6 mg/dL (ref 6–20)
CO2: 24 mmol/L (ref 22–32)
Calcium: 8.6 mg/dL — ABNORMAL LOW (ref 8.9–10.3)
Chloride: 101 mmol/L (ref 101–111)
Creatinine, Ser: 0.85 mg/dL (ref 0.61–1.24)
GFR calc Af Amer: >60 mL/min (ref >60)
GFR calc non Af Amer: >60 mL/min (ref >60)
Glucose, Bld: 202 mg/dL — ABNORMAL HIGH (ref 65–99)
Potassium: 3.7 mmol/L (ref 3.5–5.1)
Sodium: 135 mmol/L (ref 135–145)

## 2016-01-29 LAB — GLUCOSE, CAPILLARY
Glucose-Capillary: 172 mg/dL — ABNORMAL HIGH (ref 65–99)
Glucose-Capillary: 139 mg/dL — ABNORMAL HIGH (ref 65–99)
Glucose-Capillary: 118 mg/dL — ABNORMAL HIGH (ref 65–99)
Glucose-Capillary: 176 mg/dL — ABNORMAL HIGH (ref 65–99)

## 2016-01-29 LAB — CBC WITH DIFFERENTIAL/PLATELET
Basophils Absolute: 0 10*3/uL (ref 0.0–0.1)
Basophils Relative: 0 %
Eosinophils Absolute: 0.1 10*3/uL (ref 0.0–0.7)
Eosinophils Relative: 0 %
HCT: 41.7 % (ref 39.0–52.0)
Hemoglobin: 14.1 g/dL (ref 13.0–17.0)
Lymphocytes Relative: 7 %
Lymphs Abs: 1 10*3/uL (ref 0.7–4.0)
MCH: 29.4 pg (ref 26.0–34.0)
MCHC: 33.8 g/dL (ref 30.0–36.0)
MCV: 86.9 fL (ref 78.0–100.0)
Monocytes Absolute: 0.6 10*3/uL (ref 0.1–1.0)
Monocytes Relative: 4 %
Neutro Abs: 12.9 10*3/uL — ABNORMAL HIGH (ref 1.7–7.7)
Neutrophils Relative %: 89 %
Platelets: 172 10*3/uL (ref 150–400)
RBC: 4.8 MIL/uL (ref 4.22–5.81)
RDW: 12.6 % (ref 11.5–15.5)
WBC: 14.7 10*3/uL — ABNORMAL HIGH (ref 4.0–10.5)

## 2016-01-29 LAB — URINE CULTURE: Culture: >=100000 — AB

## 2016-01-29 LAB — GC/CHLAMYDIA PROBE AMP (~~LOC~~) NOT AT ARMC
Chlamydia: NEGATIVE
Neisseria Gonorrhea: NEGATIVE

## 2016-01-29 MED ORDER — ENOXAPARIN SODIUM 40 MG/0.4ML ~~LOC~~ SOLN
40.0000 mg | Freq: Every day | SUBCUTANEOUS | Status: DC
  Administered 2016-01-31: 40 mg via SUBCUTANEOUS
  Filled 2016-01-29: qty 0.4

## 2016-01-29 MED ORDER — CEFAZOLIN SODIUM-DEXTROSE 2-4 GM/100ML-% IV SOLN
2.0000 g | INTRAVENOUS | Status: DC
  Filled 2016-01-29 (×2): qty 100

## 2016-01-29 NOTE — Progress Notes (Signed)
[facility] PROGRESS NOTE  Caleb FolksGaston N Peets ZOX:[id number] DOB: 07/04/1985 DOA: 01/26/2016  PCP: Jackie PlumSEI-BONSU,GEORGE, MD  Brief History/Interval Summary: 30 year old male with a past medical history of type 2 diabetes, urethral stricture, status post suprapubic catheter, presented with complains of no urine output from his catheter site for 18 hours. He had W, or abdominal pain and was also found to have a fever of 105F. Patient was tachycardic. He was hospitalized for further management.  Reason for Visit: Urinary tract infection, septic shock  Consultants: Urology. Interventional radiology.  Procedures: None yet  Antibiotics: Ceftriaxone  Subjective/Interval History: Patient feels better. Continues to have some discomfort in his lower abdomen. Denies any nausea, vomiting or diarrhea.  ROS: Denies any chest pain or shortness of breath.  Objective:  Vital Signs  Vitals:   01/29/16 0800 01/29/16 0900 01/29/16 1000 01/29/16 1100  BP: 139/90 (!) 138/97 134/80 (!) 137/91  Pulse: 94 97 96 93  Resp: (!) 26 (!) 30 20 20   Temp: 99.4 F (37.4 C)     TempSrc: Oral     SpO2: 95% 97% 98% 98%  Weight:      Height:        Intake/Output Summary (Last 24 hours) at 01/29/16 1211 Last data filed at 01/29/16 0700  Gross per 24 hour  Intake             1950 ml  Output             1000 ml  Net              950 ml   Filed Weights   01/26/16 1912 01/26/16 2253 01/29/16 0400  Weight: 96.6 kg (213 lb) 92.1 kg (203 lb) 94.7 kg (208 lb 12.4 oz)    General appearance: alert, cooperative, appears stated age and no distress Resp: clear to auscultation bilaterally Cardio: regular rate and rhythm, S1, S2 normal, no murmur, click, rub or gallop GI: Abdomen is soft. Slightly tender around the suprapubic catheter site. No erythema noted. No masses or organomegaly. Extremities: extremities normal, atraumatic, no cyanosis or edema Neurologic: No focal neurological deficits  Lab  Results:  Data Reviewed: I have personally reviewed following labs and imaging studies  CBC:  Recent Labs Lab 01/26/16 2100 01/27/16 0347 01/28/16 0234 01/29/16 0256  WBC 11.4* 18.8* 18.1* 14.7*  NEUTROABS 11.0* 18.0* 15.2* 12.9*  HGB 18.1* 14.5 14.3 14.1  HCT 51.4 42.3 42.2 41.7  MCV 85.0 86.2 88.1 86.9  PLT 295 183 174 172    Basic Metabolic Panel:  Recent Labs Lab 01/26/16 2100 01/27/16 0347 01/28/16 0234 01/29/16 0256  NA 136 137 137 135  K 4.0 3.5 4.3 3.7  CL 97* 107 105 101  CO2 28 22 25 24   GLUCOSE 330* 272* 130* 202*  BUN 17 10 8 6   CREATININE 1.18 1.00 0.93 0.85  CALCIUM 10.2 8.2* 8.6* 8.6*  MG  --  1.1* 1.9  --   PHOS  --  2.1*  --   --     GFR: Estimated Creatinine Clearance: 141.8 mL/min (by C-G formula based on SCr of 0.85 mg/dL).  Liver Function Tests:  Recent Labs Lab 01/26/16 2100 01/27/16 0347  AST 34 36  ALT 57 44  ALKPHOS 83 54  BILITOT 0.7 0.8  PROT 9.6* 6.3*  ALBUMIN 4.8 3.1*    HbA1C:  Recent Labs  01/27/16 1424  HGBA1C 12.6*    CBG:  Recent Labs Lab 01/28/16 0806 01/28/16 1217 01/28/16 1707 01/28/16  2107 01/29/16 0812  GLUCAP 124* 183* 167* 171* 172*     Recent Results (from the past 240 hour(s))  Urine culture     Status: Abnormal   Collection Time: 01/26/16  7:50 PM  Result Value Ref Range Status   Specimen Description URINE, RANDOM  Final   Special Requests NONE  Final   Culture >=100,000 COLONIES/mL ESCHERICHIA COLI (A)  Final   Report Status 01/29/2016 FINAL  Final   Organism ID, Bacteria ESCHERICHIA COLI (A)  Final      Susceptibility   Escherichia coli - MIC*    AMPICILLIN >=32 RESISTANT Resistant     CEFAZOLIN 8 SENSITIVE Sensitive     CEFTRIAXONE <=1 SENSITIVE Sensitive     CIPROFLOXACIN <=0.25 SENSITIVE Sensitive     GENTAMICIN >=16 RESISTANT Resistant     IMIPENEM <=0.25 SENSITIVE Sensitive     NITROFURANTOIN <=16 SENSITIVE Sensitive     TRIMETH/SULFA >=320 RESISTANT Resistant      AMPICILLIN/SULBACTAM >=32 RESISTANT Resistant     PIP/TAZO <=4 SENSITIVE Sensitive     Extended ESBL NEGATIVE Sensitive     * >=100,000 COLONIES/mL ESCHERICHIA COLI  Blood Culture (routine x 2)     Status: None (Preliminary result)   Collection Time: 01/26/16 10:20 PM  Result Value Ref Range Status   Specimen Description BLOOD LEFT HAND  Final   Special Requests BOTTLES DRAWN AEROBIC AND ANAEROBIC 5CC  Final   Culture   Final    NO GROWTH 1 DAY Performed at [facility]    Report Status PENDING  Incomplete  Blood Culture (routine x 2)     Status: None (Preliminary result)   Collection Time: 01/26/16 10:45 PM  Result Value Ref Range Status   Specimen Description BLOOD RIGHT HAND  Final   Special Requests BOTTLES DRAWN AEROBIC AND ANAEROBIC 5CC  Final   Culture   Final    NO GROWTH 1 DAY Performed at [facility]    Report Status PENDING  Incomplete  MRSA PCR Screening     Status: None   Collection Time: 01/27/16  1:55 AM  Result Value Ref Range Status   MRSA by PCR NEGATIVE NEGATIVE Final    Comment:        The GeneXpert MRSA Assay (FDA approved for NASAL specimens only), is one component of a comprehensive MRSA colonization surveillance program. It is not intended to diagnose MRSA infection nor to guide or monitor treatment for MRSA infections.       Radiology Studies: No results found.   Medications:  Scheduled: .  ceFAZolin (ANCEF) IV  2 g Intravenous 30 min Pre-Op  . cefTRIAXone (ROCEPHIN)  IV  1 g Intravenous Q24H  . enoxaparin (LOVENOX) injection  40 mg Subcutaneous Daily  . insulin aspart  0-5 Units Subcutaneous QHS  . insulin aspart  0-9 Units Subcutaneous TID WC  . insulin detemir  9 Units Subcutaneous QHS   Continuous: . sodium chloride 100 mL/hr at 01/28/16 2243   WUJ:[id number] **OR** acetaminophen, albuterol, benzonatate, guaiFENesin, ondansetron **OR** ondansetron (ZOFRAN) IV, traMADol  Assessment/Plan:  Principal Problem:    Complicated urinary tract infection Active Problems:   Bulbous urethral stricture   Septic shock (HCC)   Diabetes mellitus type 2 in obese (HCC)    Septic shock 2/2 complicated urinary tract infection, catheter-related. At admission, patient had Fever 105.6, tachycardia, tachypenia, leukocytosis, lactic acidosis, low BP. Vision is given IV fluids and started on intravenous antibiotics. She should his blood pressure stabilized. He  is feeling much better. WBC is improved. Urine culture is growing Escherichia coli. Sensitivities are reviewed. Blood cultures are negative so far. Continue ceftriaxone.  Status post suprapubic catheter 2/2 bulbous urethral stricture Suprapubic catheter placed by Dr. Annabell HowellsWrenn 10/31. Seen in ED 11/16 due to stiches coming loose. Has not followed up with urology outpatient as instructed. Urology is following. Plan is for IR exchange of suprapubic catheter (postponed until right size supply available). Once afebrile 24 hours, planning for cystogram and urethrogram.   Diabetes mellitus type 2,uncontrolled Hba1c 12.6. Continue Levemir and sliding scale insulin coverage. Outpatient compliance is questionable.  Dry cough No abnormalities identified on chest x-ray.  DVT Prophylaxis: Lovenox    Code Status: Full code  Family Communication: Discussed with the patient  Disposition Plan: Management as outlined above. Mobilize as tolerated. Okay for transfer to medical-surgical floor    LOS: 3 days   West Haven Va Medical CenterKRISHNAN,Kiely Cousar  [facility] Pager [phone] 01/29/2016, 12:11 PM  If 7PM-7AM, please contact night-coverage at [email or web address], password [email or web address]

## 2016-01-29 NOTE — Progress Notes (Signed)
Assessment and Plan: 1. Urethral stricture with obstructed SP tube and sepsis now awaiting tube exchange and up sizing and subsequent retrograde urethrogram and cystogram.  Subjective: Caleb Sharp is improving with a lower fever curve and WBC count.  He is awaiting the tube exchange procedure.  ROS:  Review of Systems  Constitutional: Positive for fever.  All other systems reviewed and are negative.   Anti-infectives: Anti-infectives    Start     Dose/Rate Route Frequency Ordered Stop   01/27/16 2200  cefTRIAXone (ROCEPHIN) 1 g in dextrose 5 % 50 mL IVPB     1 g 100 mL/hr over 30 Minutes Intravenous Every 24 hours 01/27/16 0323     01/27/16 0800  cefTRIAXone (ROCEPHIN) 2 g in dextrose 5 % 50 mL IVPB  Status:  Discontinued     2 g 100 mL/hr over 30 Minutes Intravenous  Once 01/27/16 0319 01/27/16 0323   01/26/16 2130  cefTRIAXone (ROCEPHIN) 1 g in dextrose 5 % 50 mL IVPB     1 g 100 mL/hr over 30 Minutes Intravenous  Once 01/26/16 2123 01/26/16 2233      Current Facility-Administered Medications  Medication Dose Route Frequency Provider Last Rate Last Dose  . 0.9 %  sodium chloride infusion   Intravenous Continuous Clydie Braunondell A Smith, MD 100 mL/hr at 01/28/16 2243    . acetaminophen (TYLENOL) tablet 650 mg  650 mg Oral Q6H PRN Clydie Braunondell A Smith, MD   650 mg at 01/28/16 2008   Or  . acetaminophen (TYLENOL) suppository 650 mg  650 mg Rectal Q6H PRN Clydie Braunondell A Smith, MD      . albuterol (PROVENTIL) (2.5 MG/3ML) 0.083% nebulizer solution 2.5 mg  2.5 mg Nebulization Q2H PRN Clydie Braunondell A Smith, MD      . benzonatate (TESSALON) capsule 100 mg  100 mg Oral BID PRN Jordan HawksJennifer Chahn-Yang Choi, DO   100 mg at 01/28/16 1648  . cefTRIAXone (ROCEPHIN) 1 g in dextrose 5 % 50 mL IVPB  1 g Intravenous Q24H Clydie Braunondell A Smith, MD   1 g at 01/28/16 2234  . enoxaparin (LOVENOX) injection 40 mg  40 mg Subcutaneous Daily Clydie Braunondell A Smith, MD   40 mg at 01/28/16 1046  . guaiFENesin (ROBITUSSIN) 100 MG/5ML solution 100 mg   5 mL Oral Q4H PRN Carlton AdamJennifer Chahn-Yang Choi, DO      . insulin aspart (novoLOG) injection 0-5 Units  0-5 Units Subcutaneous QHS Rondell A Smith, MD      . insulin aspart (novoLOG) injection 0-9 Units  0-9 Units Subcutaneous TID WC Clydie Braunondell A Smith, MD   2 Units at 01/28/16 1710  . insulin detemir (LEVEMIR) injection 9 Units  9 Units Subcutaneous QHS Jordan HawksJennifer Chahn-Yang Choi, DO   9 Units at 01/28/16 2234  . ondansetron (ZOFRAN) tablet 4 mg  4 mg Oral Q6H PRN Clydie Braunondell A Smith, MD       Or  . ondansetron (ZOFRAN) injection 4 mg  4 mg Intravenous Q6H PRN Clydie Braunondell A Smith, MD      . traMADol (ULTRAM) tablet 50 mg  50 mg Oral Q6H PRN Jordan HawksJennifer Chahn-Yang Choi, DO   50 mg at 01/28/16 1648     Objective: Vital signs in last 24 hours: Temp:  [98.6 F (37 C)-100.7 F (38.2 C)] 100.3 F (37.9 C) (11/29 0400) Pulse Rate:  [95-107] 99 (11/29 0400) Resp:  [8-31] 28 (11/29 0400) BP: (123-147)/(88-112) 147/109 (11/29 0400) SpO2:  [95 %-97 %] 97 % (11/29 0400) Weight:  [94.7 kg (208  lb 12.4 oz)] 94.7 kg (208 lb 12.4 oz) (11/29 0400)  Intake/Output from previous day: 11/28 0701 - 11/29 0700 In: 2350 [I.V.:2300; IV Piggyback:50] Out: 1400 [Urine:1400] Intake/Output this shift: Total I/O In: 1150 [I.V.:1100; IV Piggyback:50] Out: 1000 [Urine:1000]   Physical Exam  Constitutional: He is well-developed, well-nourished, and in no distress.  Vitals reviewed.   Lab Results:   Recent Labs  01/28/16 0234 01/29/16 0256  WBC 18.1* 14.7*  HGB 14.3 14.1  HCT 42.2 41.7  PLT 174 172   BMET  Recent Labs  01/28/16 0234 01/29/16 0256  NA 137 135  K 4.3 3.7  CL 105 101  CO2 25 24  GLUCOSE 130* 202*  BUN 8 6  CREATININE 0.93 0.85  CALCIUM 8.6* 8.6*   PT/INR No results for input(s): LABPROT, INR in the last 72 hours. ABG  Recent Labs  01/26/16 2106  HCO3 31.7*    Studies/Results: No results found.          LOS: 3 days    Anner CreteWRENN,Caleb Sharp 01/29/2016 [phone] ID:  Caleb FolksGaston N Sharp, male   DOB: 09-24-1985, 30 y.o.   MRN: [id number]

## 2016-01-30 ENCOUNTER — Inpatient Hospital Stay (HOSPITAL_COMMUNITY): Payer: BLUE CROSS/BLUE SHIELD

## 2016-01-30 ENCOUNTER — Encounter (HOSPITAL_COMMUNITY): Payer: Self-pay | Admitting: Interventional Radiology

## 2016-01-30 HISTORY — PX: IR GENERIC HISTORICAL: IMG1180011

## 2016-01-30 LAB — CBC WITH DIFFERENTIAL/PLATELET
Basophils Absolute: 0 10*3/uL (ref 0.0–0.1)
Basophils Relative: 0 %
Eosinophils Absolute: 0 10*3/uL (ref 0.0–0.7)
Eosinophils Relative: 0 %
HCT: 41 % (ref 39.0–52.0)
Hemoglobin: 14.2 g/dL (ref 13.0–17.0)
Lymphocytes Relative: 16 %
Lymphs Abs: 1.7 10*3/uL (ref 0.7–4.0)
MCH: 30 pg (ref 26.0–34.0)
MCHC: 34.6 g/dL (ref 30.0–36.0)
MCV: 86.5 fL (ref 78.0–100.0)
Monocytes Absolute: 1.5 10*3/uL — ABNORMAL HIGH (ref 0.1–1.0)
Monocytes Relative: 14 %
Neutro Abs: 7.1 10*3/uL (ref 1.7–7.7)
Neutrophils Relative %: 70 %
Platelets: 212 10*3/uL (ref 150–400)
RBC: 4.74 MIL/uL (ref 4.22–5.81)
RDW: 12.4 % (ref 11.5–15.5)
WBC: 10.3 10*3/uL (ref 4.0–10.5)

## 2016-01-30 LAB — GLUCOSE, CAPILLARY
Glucose-Capillary: 151 mg/dL — ABNORMAL HIGH (ref 65–99)
Glucose-Capillary: 157 mg/dL — ABNORMAL HIGH (ref 65–99)
Glucose-Capillary: 123 mg/dL — ABNORMAL HIGH (ref 65–99)
Glucose-Capillary: 216 mg/dL — ABNORMAL HIGH (ref 65–99)

## 2016-01-30 LAB — BASIC METABOLIC PANEL
Anion gap: 10 (ref 5–15)
BUN: 8 mg/dL (ref 6–20)
CO2: 22 mmol/L (ref 22–32)
Calcium: 8.6 mg/dL — ABNORMAL LOW (ref 8.9–10.3)
Chloride: 103 mmol/L (ref 101–111)
Creatinine, Ser: 0.81 mg/dL (ref 0.61–1.24)
GFR calc Af Amer: >60 mL/min (ref >60)
GFR calc non Af Amer: >60 mL/min (ref >60)
Glucose, Bld: 166 mg/dL — ABNORMAL HIGH (ref 65–99)
Potassium: 3.4 mmol/L — ABNORMAL LOW (ref 3.5–5.1)
Sodium: 135 mmol/L (ref 135–145)

## 2016-01-30 MED ORDER — LIDOCAINE HCL 1 % IJ SOLN
INTRAMUSCULAR | Status: AC
  Filled 2016-01-30: qty 20

## 2016-01-30 MED ORDER — FENTANYL CITRATE (PF) 100 MCG/2ML IJ SOLN
INTRAMUSCULAR | Status: AC | PRN
  Administered 2016-01-30: 100 ug via INTRAVENOUS

## 2016-01-30 MED ORDER — LIDOCAINE HCL (PF) 1 % IJ SOLN
INTRAMUSCULAR | Status: AC | PRN
  Administered 2016-01-30: 30 mL

## 2016-01-30 MED ORDER — HYDROMORPHONE HCL 1 MG/ML IJ SOLN
INTRAMUSCULAR | Status: AC | PRN
  Administered 2016-01-30: 1 mg via INTRAVENOUS

## 2016-01-30 MED ORDER — HYDROMORPHONE HCL 1 MG/ML IJ SOLN
INTRAMUSCULAR | Status: AC
  Filled 2016-01-30: qty 1

## 2016-01-30 MED ORDER — MIDAZOLAM HCL 2 MG/2ML IJ SOLN
INTRAMUSCULAR | Status: AC | PRN
  Administered 2016-01-30: 2 mg via INTRAVENOUS

## 2016-01-30 MED ORDER — DIPHENHYDRAMINE HCL 25 MG PO CAPS
25.0000 mg | ORAL_CAPSULE | Freq: Once | ORAL | Status: AC
  Administered 2016-01-30: 25 mg via ORAL
  Filled 2016-01-30: qty 1

## 2016-01-30 MED ORDER — FENTANYL CITRATE (PF) 100 MCG/2ML IJ SOLN
INTRAMUSCULAR | Status: AC
  Filled 2016-01-30: qty 2

## 2016-01-30 MED ORDER — KETOROLAC TROMETHAMINE 30 MG/ML IJ SOLN
INTRAMUSCULAR | Status: AC
  Filled 2016-01-30: qty 1

## 2016-01-30 MED ORDER — POTASSIUM CHLORIDE CRYS ER 20 MEQ PO TBCR
40.0000 meq | EXTENDED_RELEASE_TABLET | Freq: Once | ORAL | Status: AC
  Administered 2016-01-30: 40 meq via ORAL
  Filled 2016-01-30: qty 2

## 2016-01-30 MED ORDER — IOPAMIDOL (ISOVUE-300) INJECTION 61%
INTRAVENOUS | Status: AC
  Administered 2016-01-30: 20 mL
  Filled 2016-01-30: qty 50

## 2016-01-30 MED ORDER — MIDAZOLAM HCL 2 MG/2ML IJ SOLN
INTRAMUSCULAR | Status: AC
  Filled 2016-01-30: qty 2

## 2016-01-30 NOTE — Procedures (Signed)
Interventional Radiology Procedure Note  Procedure: Exchange of previous supra-pubic catheter for a new 49F pigtail.  12-14-49F soft tissue dilation performed.   Complications: None Recommendations:  - Supra-pubic catheter to gravity drain  - Do not submerge site. - Routine wound care - Follow with Dr. Annabell HowellsWrenn   Signed,  Yvone NeuJaime S. Loreta AveWagner, DO

## 2016-01-30 NOTE — Progress Notes (Signed)
[facility] PROGRESS NOTE  Caleb Sharp ZOX:[id number] DOB: 02-05-1986 DOA: 01/26/2016  PCP: Jackie Plum, MD  Brief History/Interval Summary: 30 year old male with a past medical history of type 2 diabetes, urethral stricture, status post suprapubic catheter, presented with complains of no urine output from his catheter site for 18 hours. He had abdominal pain and was also found to have a fever of 105F. Patient was tachycardic. He was hospitalized for further management.  Reason for Visit: Urinary tract infection, septic shock  Consultants: Urology. Interventional radiology.  Procedures: None yet  Antibiotics: Ceftriaxone  Subjective/Interval History: Patient feels about the same. Denies any abdominal pain, nausea or vomiting. Waiting on his procedure.   ROS: Denies any chest pain or shortness of breath.  Objective:  Vital Signs  Vitals:   01/29/16 1100 01/29/16 1200 01/29/16 2048 01/30/16 0502  BP: (!) 137/91 (!) 144/97 (!) 159/99 (!) 150/96  Pulse: 93 (!) 104 (!) 109 90  Resp: 20 (!) 28 18 18   Temp:   (!) 102.2 F (39 C) 99.7 F (37.6 C)  TempSrc:   Oral Oral  SpO2: 98% 95% 94% 97%  Weight:      Height:        Intake/Output Summary (Last 24 hours) at 01/30/16 0825 Last data filed at 01/30/16 0502  Gross per 24 hour  Intake             1620 ml  Output             3751 ml  Net            -2131 ml   Filed Weights   01/26/16 1912 01/26/16 2253 01/29/16 0400  Weight: 96.6 kg (213 lb) 92.1 kg (203 lb) 94.7 kg (208 lb 12.4 oz)    General appearance: alert, cooperative, appears stated age and no distress Resp: clear to auscultation bilaterally Cardio: regular rate and rhythm, S1, S2 normal, no murmur, click, rub or gallop GI: Abdomen is soft. Slightly tender around the suprapubic catheter site. No erythema noted. No masses or organomegaly. Extremities: extremities normal, atraumatic, no cyanosis or edema Neurologic: No focal neurological  deficits  Lab Results:  Data Reviewed: I have personally reviewed following labs and imaging studies  CBC:  Recent Labs Lab 01/26/16 2100 01/27/16 0347 01/28/16 0234 01/29/16 0256 01/30/16 0400  WBC 11.4* 18.8* 18.1* 14.7* 10.3  NEUTROABS 11.0* 18.0* 15.2* 12.9* 7.1  HGB 18.1* 14.5 14.3 14.1 14.2  HCT 51.4 42.3 42.2 41.7 41.0  MCV 85.0 86.2 88.1 86.9 86.5  PLT 295 183 174 172 212    Basic Metabolic Panel:  Recent Labs Lab 01/26/16 2100 01/27/16 0347 01/28/16 0234 01/29/16 0256 01/30/16 0400  NA 136 137 137 135 135  K 4.0 3.5 4.3 3.7 3.4*  CL 97* 107 105 101 103  CO2 28 22 25 24 22   GLUCOSE 330* 272* 130* 202* 166*  BUN 17 10 8 6 8   CREATININE 1.18 1.00 0.93 0.85 0.81  CALCIUM 10.2 8.2* 8.6* 8.6* 8.6*  MG  --  1.1* 1.9  --   --   PHOS  --  2.1*  --   --   --     GFR: Estimated Creatinine Clearance: 148.8 mL/min (by C-G formula based on SCr of 0.81 mg/dL).  Liver Function Tests:  Recent Labs Lab 01/26/16 2100 01/27/16 0347  AST 34 36  ALT 57 44  ALKPHOS 83 54  BILITOT 0.7 0.8  PROT 9.6* 6.3*  ALBUMIN 4.8 3.1*  HbA1C:  Recent Labs  01/27/16 1424  HGBA1C 12.6*    CBG:  Recent Labs Lab 01/29/16 0812 01/29/16 1251 01/29/16 1603 01/29/16 2059 01/30/16 0717  GLUCAP 172* 139* 118* 176* 151*     Recent Results (from the past 240 hour(s))  Urine culture     Status: Abnormal   Collection Time: 01/26/16  7:50 PM  Result Value Ref Range Status   Specimen Description URINE, RANDOM  Final   Special Requests NONE  Final   Culture >=100,000 COLONIES/mL ESCHERICHIA COLI (A)  Final   Report Status 01/29/2016 FINAL  Final   Organism ID, Bacteria ESCHERICHIA COLI (A)  Final      Susceptibility   Escherichia coli - MIC*    AMPICILLIN >=32 RESISTANT Resistant     CEFAZOLIN 8 SENSITIVE Sensitive     CEFTRIAXONE <=1 SENSITIVE Sensitive     CIPROFLOXACIN <=0.25 SENSITIVE Sensitive     GENTAMICIN >=16 RESISTANT Resistant     IMIPENEM <=0.25  SENSITIVE Sensitive     NITROFURANTOIN <=16 SENSITIVE Sensitive     TRIMETH/SULFA >=320 RESISTANT Resistant     AMPICILLIN/SULBACTAM >=32 RESISTANT Resistant     PIP/TAZO <=4 SENSITIVE Sensitive     Extended ESBL NEGATIVE Sensitive     * >=100,000 COLONIES/mL ESCHERICHIA COLI  Blood Culture (routine x 2)     Status: None (Preliminary result)   Collection Time: 01/26/16 10:20 PM  Result Value Ref Range Status   Specimen Description BLOOD LEFT HAND  Final   Special Requests BOTTLES DRAWN AEROBIC AND ANAEROBIC 5CC  Final   Culture   Final    NO GROWTH 2 DAYS [facility]    Report Status PENDING  Incomplete  Blood Culture (routine x 2)     Status: None (Preliminary result)   Collection Time: 01/26/16 10:45 PM  Result Value Ref Range Status   Specimen Description BLOOD RIGHT HAND  Final   Special Requests BOTTLES DRAWN AEROBIC AND ANAEROBIC 5CC  Final   Culture   Final    NO GROWTH 2 DAYS [facility]     Report Status PENDING  Incomplete  MRSA PCR Screening     Status: None   Collection Time: 01/27/16  1:55 AM  Result Value Ref Range Status   MRSA by PCR NEGATIVE NEGATIVE Final    Comment:        The GeneXpert MRSA Assay (FDA approved for NASAL specimens only), is one component of a comprehensive MRSA colonization surveillance program. It is not intended to diagnose MRSA infection nor to guide or monitor treatment for MRSA infections.       Radiology Studies: No results found.   Medications:  Scheduled: .  ceFAZolin (ANCEF) IV  2 g Intravenous 30 min Pre-Op  . cefTRIAXone (ROCEPHIN)  IV  1 g Intravenous Q24H  . [START ON 01/31/2016] enoxaparin (LOVENOX) injection  40 mg Subcutaneous Daily  . insulin aspart  0-5 Units Subcutaneous QHS  . insulin aspart  0-9 Units Subcutaneous TID WC  . insulin detemir  9 Units Subcutaneous QHS   Continuous: . sodium chloride 100 mL/hr at 01/29/16 1810   RUE:[id number]:acetaminophen **OR**  acetaminophen, albuterol, benzonatate, guaiFENesin, ondansetron **OR** ondansetron (ZOFRAN) IV, traMADol  Assessment/Plan:  Principal Problem:   Complicated urinary tract infection Active Problems:   Bulbous urethral stricture   Septic shock (HCC)   Diabetes mellitus type 2 in obese (HCC)    Septic shock 2/2 complicated urinary tract infection, catheter-related. At admission,  patient had Fever 105.6, tachycardia, tachypenia, leukocytosis, lactic acidosis, low BP. Patient was given IV fluids and started on intravenous antibiotics. His blood pressure stabilized. WBC is improved. Urine culture is growing Escherichia coli. Sensitivities are reviewed. Blood cultures are negative so far. Continue ceftriaxone for now. Continues to have fever. Monitor for now.  Status post suprapubic catheter 2/2 bulbous urethral stricture Suprapubic catheter placed by Dr. Annabell HowellsWrenn 10/31. Seen in ED 11/16 due to stiches coming loose. Has not followed up with urology outpatient as instructed. Urology is following. Plan is for IR exchange of suprapubic catheter (postponed until right size supply available). Once afebrile 24 hours, planning for cystogram and urethrogram.   Diabetes mellitus type 2,uncontrolled Hba1c 12.6. Continue Levemir and sliding scale insulin coverage. CBGs are reasonably well controlled here. Outpatient compliance is questionable.  Dry cough No abnormalities identified on chest x-ray.  DVT Prophylaxis: Lovenox    Code Status: Full code  Family Communication: Discussed with the patient  Disposition Plan: Management as outlined above. Await catheter exchange by interventional radiology. Discussed with Dr. Annabell HowellsWrenn this morning.     LOS: 4 days   [facility] CenterKRISHNAN,Mackynzie Woolford  [facility] Pager [phone] 01/30/2016, 8:25 AM  If 7PM-7AM, please contact night-coverage at [email or web address], password [email or web address]

## 2016-01-30 NOTE — Progress Notes (Signed)
Patient complaining of itching after coming back from procedure.  MD notified and new order received.  Benadryl 25mg  PO given.  Will continue to monitor.  Caleb Sharp, [name]

## 2016-01-30 NOTE — Sedation Documentation (Signed)
procedural pain

## 2016-01-31 ENCOUNTER — Inpatient Hospital Stay (HOSPITAL_COMMUNITY): Payer: BLUE CROSS/BLUE SHIELD

## 2016-01-31 LAB — CBC WITH DIFFERENTIAL/PLATELET
Basophils Absolute: 0 10*3/uL (ref 0.0–0.1)
Basophils Relative: 0 %
Eosinophils Absolute: 0.1 10*3/uL (ref 0.0–0.7)
Eosinophils Relative: 2 %
HCT: 42.2 % (ref 39.0–52.0)
Hemoglobin: 14.4 g/dL (ref 13.0–17.0)
Lymphocytes Relative: 29 %
Lymphs Abs: 2.2 10*3/uL (ref 0.7–4.0)
MCH: 29.5 pg (ref 26.0–34.0)
MCHC: 34.1 g/dL (ref 30.0–36.0)
MCV: 86.5 fL (ref 78.0–100.0)
Monocytes Absolute: 0.9 10*3/uL (ref 0.1–1.0)
Monocytes Relative: 12 %
Neutro Abs: 4.3 10*3/uL (ref 1.7–7.7)
Neutrophils Relative %: 57 %
Platelets: 217 10*3/uL (ref 150–400)
RBC: 4.88 MIL/uL (ref 4.22–5.81)
RDW: 12.6 % (ref 11.5–15.5)
WBC: 7.6 10*3/uL (ref 4.0–10.5)

## 2016-01-31 LAB — BASIC METABOLIC PANEL
Anion gap: 11 (ref 5–15)
BUN: 9 mg/dL (ref 6–20)
CO2: 22 mmol/L (ref 22–32)
Calcium: 8.9 mg/dL (ref 8.9–10.3)
Chloride: 102 mmol/L (ref 101–111)
Creatinine, Ser: 0.75 mg/dL (ref 0.61–1.24)
GFR calc Af Amer: >60 mL/min (ref >60)
GFR calc non Af Amer: >60 mL/min (ref >60)
Glucose, Bld: 204 mg/dL — ABNORMAL HIGH (ref 65–99)
Potassium: 3.8 mmol/L (ref 3.5–5.1)
Sodium: 135 mmol/L (ref 135–145)

## 2016-01-31 LAB — GLUCOSE, CAPILLARY
Glucose-Capillary: 168 mg/dL — ABNORMAL HIGH (ref 65–99)
Glucose-Capillary: 250 mg/dL — ABNORMAL HIGH (ref 65–99)

## 2016-01-31 MED ORDER — LISINOPRIL 10 MG PO TABS
10.0000 mg | ORAL_TABLET | Freq: Every day | ORAL | Status: DC
  Administered 2016-01-31: 10 mg via ORAL
  Filled 2016-01-31: qty 1

## 2016-01-31 MED ORDER — IOTHALAMATE MEGLUMINE 17.2 % UR SOLN
500.0000 mL | Freq: Once | URETHRAL | Status: AC | PRN
Start: 2016-01-31 — End: 2016-01-31
  Administered 2016-01-31: 20 mL via INTRAVESICAL

## 2016-01-31 MED ORDER — CEFPODOXIME PROXETIL 200 MG PO TABS: 200.0000 mg | ORAL_TABLET | Freq: Two times a day (BID) | ORAL | 0 refills | Status: DC

## 2016-01-31 MED ORDER — CEFPODOXIME PROXETIL 200 MG PO TABS
200.0000 mg | ORAL_TABLET | Freq: Two times a day (BID) | ORAL | Status: DC
  Administered 2016-01-31: 200 mg via ORAL
  Filled 2016-01-31 (×2): qty 1

## 2016-01-31 MED ORDER — LISINOPRIL 10 MG PO TABS: 10.0000 mg | ORAL_TABLET | Freq: Every day | ORAL | 0 refills | Status: AC

## 2016-01-31 MED ORDER — HYDROCODONE-ACETAMINOPHEN 5-325 MG PO TABS: 1.0000 | ORAL_TABLET | Freq: Four times a day (QID) | ORAL | 0 refills | Status: DC | PRN

## 2016-01-31 MED ORDER — METFORMIN HCL 1000 MG PO TABS: 1000.0000 mg | ORAL_TABLET | Freq: Two times a day (BID) | ORAL | 0 refills | Status: DC

## 2016-01-31 NOTE — Progress Notes (Signed)
Pt noted BP 162/100 notified on call K.Kirby,NP via text page with no new  orders.

## 2016-01-31 NOTE — Progress Notes (Signed)
Pt is anxious to go home. Supra-pubic cath intact, draining to a clear yellow urine.  Discharge instructions given to pt, verbalized understanding.  Discharged to home accompanied by girlfriend.

## 2016-01-31 NOTE — Discharge Instructions (Signed)
Catheter-Associated Urinary Tract Infection FAQs °What is "catheter-associated urinary tract infection"?  °A urinary tract infection (also called “UTI”) is an infection in the urinary system, which includes the bladder (which stores the urine) and the kidneys (which filter the blood to make urine). Germs (for example, bacteria or yeasts) do not normally live in these areas; but if germs are introduced, an infection can occur. °If you have a urinary catheter, germs can travel along the catheter and cause an infection in your bladder or your kidney; in that case it is called a catheter-associated urinary tract infection (or “CA-UTI”). °What is a urinary catheter?  °A urinary catheter is a thin tube placed in the bladder to drain urine. Urine drains through the tube into a bag that collects the urine. A urinary catheter may be used: °· If you are not able to urinate on your own °· To measure the amount of urine that you make, for example, during intensive care °· During and after some types of surgery °· During some tests of the kidneys and bladder °People with urinary catheters have a much higher chance of getting a urinary tract infection than people who don’t have a catheter. °How do I get a catheter-associated urinary tract infection (CA-UTI)?  °If germs enter the urinary tract, they may cause an infection. Many of the germs that cause a catheter-associated urinary tract infection are common germs found in your intestines that do not usually cause an infection there. Germs can enter the urinary tract when the catheter is being put in or while the catheter remains in the bladder. °What are the symptoms of a urinary tract infection?  °Some of the common symptoms of a urinary tract infection are: °· Burning or pain in the lower abdomen (that is, below the stomach) °· Fever °· Bloody urine may be a sign of infection, but is also caused by other problems °· Burning during urination or an increase in the frequency of  urination after the catheter is removed. °Sometimes people with catheter-associated urinary tract infections do not have these symptoms of infection. °Can catheter-associated urinary tract infections be treated?  °Yes, most catheter-associated urinary tract infections can be treated with antibiotics and removal or change of the catheter. Your doctor will determine which antibiotic is best for you. °What are some of the things that hospitals are doing to prevent catheter-associated urinary tract infections?  °To prevent urinary tract infections, doctors and nurses take the following actions. °Catheter insertion °· Catheters are put in only when necessary and they are removed as soon as possible. °· Only properly trained persons insert catheters using sterile (“clean”) technique. °· The skin in the area where the catheter will be inserted is cleaned before inserting the catheter. °· Other methods to drain the urine are sometimes used, such as: °¨ External catheters in men (these look like condoms and are placed over the penis rather than into the penis) °¨ Putting a temporary catheter in to drain the urine and removing it right away. This is called intermittent urethral catheterization. °Catheter care °· Healthcare providers clean their hands by washing them with soap and water or using an alcohol-based hand rub before and after touching your catheter. °· If you do not see your providers clean their hands, please ask them to do so. °· Avoid disconnecting the catheter and drain tube. This helps to prevent germs from getting into the catheter tube. °· The catheter is secured to the leg to prevent pulling on the catheter. °·   Avoid twisting or kinking the catheter. °· Keep the bag lower than the bladder to prevent urine from backflowing to the bladder. °· Empty the bag regularly. The drainage spout should not touch anything while emptying the bag. °What can I do to help prevent catheter-associated urinary tract infections  if I have a catheter? °· Always clean your hands before and after doing catheter care. °· Always keep your urine bag below the level of your bladder. °· Do not tug or pull on the tubing. °· Do not twist or kink the catheter tubing. °· Ask your healthcare provider each day if you still need the catheter. °What do I need to do when I go home from the hospital? °· If you will be going home with a catheter, your doctor or nurse should explain everything you need to know about taking care of the catheter. Make sure you understand how to care for it before you leave the hospital. °· If you develop any of the symptoms of a urinary tract infection, such as burning or pain in the lower abdomen, fever, or an increase in the frequency of urination, contact your doctor or nurse immediately. °· Before you go home, make sure you know who to contact if you have questions or problems after you get home. °If you have questions, please ask your doctor or nurse.  °Developed and co-sponsored by The Society for Healthcare Epidemiology of America (SHEA); Infectious Diseases Society of America (IDSA); American Hospital Association; Association for Professionals in Infection Control and Epidemiology (APIC); Centers for Disease Control and Prevention (CDC); and The Joint Commission.  °This information is not intended to replace advice given to you by your health care provider. Make sure you discuss any questions you have with your health care provider. °Document Released: 11/11/2011 Document Revised: 07/31/2015 Document Reviewed: 05/02/2014 °Elsevier Interactive Patient Education © 2017 [employer] ° °

## 2016-01-31 NOTE — Discharge Summary (Signed)
[facility]  Physician Discharge Summary   Patient ID: Caleb Sharp MRN: [id number] DOB/AGE: 08-29-1985 30 y.o.  Admit date: 01/26/2016 Discharge date: 01/31/2016  PCP: Jackie Plum, MD  DISCHARGE DIAGNOSES:  Principal Problem:   Complicated urinary tract infection Active Problems:   Bulbous urethral stricture   Septic shock (HCC)   Diabetes mellitus type 2 in obese (HCC)   RECOMMENDATIONS FOR OUTPATIENT FOLLOW UP: Dr. Annabell Howells will arrange outpatient follow up.  Patient to see PCP within a week regarding your diabetes.  Patient will benefit from being on insulin. But he has declined for now.   DISCHARGE CONDITION: good  Diet recommendation: Modified, carbohydrate  Filed Weights   01/26/16 1912 01/26/16 2253 01/29/16 0400  Weight: 96.6 kg (213 lb) 92.1 kg (203 lb) 94.7 kg (208 lb 12.4 oz)    INITIAL HISTORY: 30 year old male with a past medical history of type 2 diabetes, urethral stricture, status post suprapubic catheter, presented with complains of no urine output from his catheter site for 18 hours. He had abdominal pain and was also found to have a fever of 105F. Patient was tachycardic. He was hospitalized for further management.  Consultations:  Urology and interventional radiology  Procedures: Exchange of previous supra-pubic catheter for a new 14F pigtail.  12-14-14F soft tissue dilation performed.   HOSPITAL COURSE:   Septic shock 2/2 complicated urinary tract infection, catheter-related. At admission, patient had Fever 105.6, tachycardia, tachypenia, leukocytosis, lactic acidosis, low BP. Patient was given IV fluids and started on intravenous antibiotics. His blood pressure stabilized. WBC is improved. Urine culture is growing Escherichia coli. Sensitivities are reviewed. Blood cultures are negative so far. Patient transitioned to [facility] and will be discharged on the same. He will be treated for 10 days total.  Status post suprapubic  catheter 2/2 bulbous urethral stricture Suprapubic catheter placed by Dr. Annabell Howells 10/31. Seen in ED 11/16 due to stiches coming loose. Has not followed up with urology outpatient as instructed. Urology was consulted. They recommended that interventional radiology exchange his catheter. This was done on 11/30. There was a delay due to unavailable catheter, which had to be specially ordered. Patient subsequently also underwent cystogram and urethrogram. This has been done this afternoon. Images reviewed by Dr. 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  Diabetes mellitus type 2,uncontrolled Hba1c 12.6. Patient was on metformin at home, but he had not been compliant. In the hospital. He was started on Levemir with good control of his blood glucose levels. However, patient does not want to be on insulin. Extensive discussions were held with him regarding this. However, he was adamant that he wanted to just be on metformin. He was educated about the potential complications of uncontrolled diabetes. He would like to speak to his PCP first before deciding on insulin.   Essential hypertension, newly diagnosed Blood pressure was noted to be initially low due to septic shock. And after he had stabilized his blood pressure was in the hypertensive range. He will benefit from being on ACE inhibitor, which has been initiated.  Overall, stable. Patient wants to go home today. Okay for discharge.   PERTINENT LABS:  The results of significant diagnostics from this hospitalization (including imaging, microbiology, ancillary and laboratory) are listed below for reference.    Microbiology: Recent Results (from the past 240 hour(s))  Urine culture     Status: Abnormal   Collection Time: 01/26/16  7:50 PM  Result Value Ref  Range Status   Specimen Description URINE, RANDOM  Final   Special Requests NONE  Final   Culture >=100,000 COLONIES/mL ESCHERICHIA  COLI (A)  Final   Report Status 01/29/2016 FINAL  Final   Organism ID, Bacteria ESCHERICHIA COLI (A)  Final      Susceptibility   Escherichia coli - MIC*    AMPICILLIN >=32 RESISTANT Resistant     CEFAZOLIN 8 SENSITIVE Sensitive     CEFTRIAXONE <=1 SENSITIVE Sensitive     CIPROFLOXACIN <=0.25 SENSITIVE Sensitive     GENTAMICIN >=16 RESISTANT Resistant     IMIPENEM <=0.25 SENSITIVE Sensitive     NITROFURANTOIN <=16 SENSITIVE Sensitive     TRIMETH/SULFA >=320 RESISTANT Resistant     AMPICILLIN/SULBACTAM >=32 RESISTANT Resistant     PIP/TAZO <=4 SENSITIVE Sensitive     Extended ESBL NEGATIVE Sensitive     * >=100,000 COLONIES/mL ESCHERICHIA COLI  Blood Culture (routine x 2)     Status: None (Preliminary result)   Collection Time: 01/26/16 10:20 PM  Result Value Ref Range Status   Specimen Description BLOOD LEFT HAND  Final   Special Requests BOTTLES DRAWN AEROBIC AND ANAEROBIC 5CC  Final   Culture   Final    NO GROWTH 4 DAYS Performed at [facility]    Report Status PENDING  Incomplete  Blood Culture (routine x 2)     Status: None (Preliminary result)   Collection Time: 01/26/16 10:45 PM  Result Value Ref Range Status   Specimen Description BLOOD RIGHT HAND  Final   Special Requests BOTTLES DRAWN AEROBIC AND ANAEROBIC 5CC  Final   Culture   Final    NO GROWTH 4 DAYS Performed at [facility]     Report Status PENDING  Incomplete  MRSA PCR Screening     Status: None   Collection Time: 01/27/16  1:55 AM  Result Value Ref Range Status   MRSA by PCR NEGATIVE NEGATIVE Final    Comment:        The GeneXpert MRSA Assay (FDA approved for NASAL specimens only), is one component of a comprehensive MRSA colonization surveillance program. It is not intended to diagnose MRSA infection nor to guide or monitor treatment for MRSA infections.      Labs: Basic Metabolic Panel:  Recent Labs Lab 01/27/16 0347 01/28/16 0234 01/29/16 0256 01/30/16 0400  01/31/16 0446  NA 137 137 135 135 135  K 3.5 4.3 3.7 3.4* 3.8  CL 107 105 101 103 102  CO2 22 25 24 22 22   GLUCOSE 272* 130* 202* 166* 204*  BUN 10 8 6 8 9   CREATININE 1.00 0.93 0.85 0.81 0.75  CALCIUM 8.2* 8.6* 8.6* 8.6* 8.9  MG 1.1* 1.9  --   --   --   PHOS 2.1*  --   --   --   --    Liver Function Tests:  Recent Labs Lab 01/26/16 2100 01/27/16 0347  AST 34 36  ALT 57 44  ALKPHOS 83 54  BILITOT 0.7 0.8  PROT 9.6* 6.3*  ALBUMIN 4.8 3.1*   CBC:  Recent Labs Lab 01/27/16 0347 01/28/16 0234 01/29/16 0256 01/30/16 0400 01/31/16 0446  WBC 18.8* 18.1* 14.7* 10.3 7.6  NEUTROABS 18.0* 15.2* 12.9* 7.1 4.3  HGB 14.5 14.3 14.1 14.2 14.4  HCT 42.3 42.2 41.7 41.0 42.2  MCV 86.2 88.1 86.9 86.5 86.5  PLT 183 174 172 212 217    CBG:  Recent Labs Lab 01/30/16 1119 01/30/16 1703 01/30/16 2134  01/31/16 0753 01/31/16 1230  GLUCAP 157* 123* 216* 168* 250*     IMAGING STUDIES Ir Catheter Tube Change  Result Date: 01/30/2016 INDICATION: 30 year old male with a history of false passage during urethral catheter placement and need for suprapubic catheter which was placed surgically 12/31/2015. He returns today for up sizing. EXAM: IR CATHETER TUBE CHANGE COMPARISON:  None. MEDICATIONS: None ANESTHESIA/SEDATION: Fentanyl 100 mcg IV; Versed 2.0 mg IV, 1 mg Dilaudid, 30 mg Toradol Moderate Sedation Time:  15 minutes The patient was continuously monitored during the procedure by the interventional radiology nurse under my direct supervision. CONTRAST:  20 cc - administered into the collecting system(s) FLUOROSCOPY TIME:  Fluoroscopy Time: 0 minutes 36 seconds (6 mGy). COMPLICATIONS: None immediate. PROCEDURE: Informed written consent was obtained from the patient after a thorough discussion of the procedural risks, benefits and alternatives. All questions were addressed. Maximal Sterile Barrier Technique was utilized including caps, mask, sterile gowns, sterile gloves, sterile drape,  hand hygiene and skin antiseptic. A timeout was performed prior to the initiation of the procedure. The patient's suprapubic catheter was prepped and draped in usual sterile fashion. The skin and subcutaneous tissues were generously infiltrated 1% lidocaine for local anesthesia. The catheter was injected confirming location within the urinary bladder which was relatively decompressed. A Bentson wire was advanced through the catheter which was removed over the wire. Using modified Seldinger technique, serial soft tissue dilation of the soft tissue tract was achieved with 12 [place]French, 14 [place]French, and 16 French soft tissue dilator. Final a 16 French pigtail drainage catheter was placed over the Bentson wire with the pigtail with catheter locked in place. Contrast infused confirmed location within the urinary bladder. Patient tolerated the procedure well and remained hemodynamically stable throughout. No complications were encountered and no significant blood loss encountered. IMPRESSION: Status post exchange of suprapubic catheter with new 16 French pigtail placed. Signed, Yvone NeuJaime S. Loreta AveWagner, DO [facility] Electronically Signed   By: Gilmer MorJaime  Wagner D.O.   On: 01/30/2016 15:53   Portable Chest 1 View  Result Date: 01/27/2016 CLINICAL DATA:  Sepsis. EXAM: PORTABLE CHEST 1 VIEW COMPARISON:  11/06/2014. FINDINGS: Mediastinum and hilar structures are normal. Heart size stable. Low lung volumes. No pleural effusion or pneumothorax. IMPRESSION: Low lung volumes.  No focal infiltrate noted. Electronically Signed   By: Maisie Fushomas  Register   On: 01/27/2016 07:15   Dg Abd Portable 1v  Result Date: 01/27/2016 CLINICAL DATA:  Sepsis. EXAM: PORTABLE ABDOMEN - 1 VIEW COMPARISON:  CT 06/18/2015. FINDINGS: Catheter is noted overlying the pelvis. Soft tissue structures are unremarkable. No bowel distention or free air. No acute bony abnormality. IMPRESSION: No acute  abnormality.  No bowel distention. Electronically Signed   By: Maisie Fushomas  Register   On: 01/27/2016 07:14    DISCHARGE EXAMINATION: Vitals:   01/30/16 2137 01/30/16 2251 01/31/16 0525 01/31/16 0538  BP: (!) 154/107 (!) 154/92 (!) 166/103 (!) 162/100  Pulse: 86 88 75 77  Resp: 17  16   Temp: 98.1 F (36.7 C)  98.7 F (37.1 C)   TempSrc: Oral  Oral   SpO2: 97%  98%   Weight:      Height:       General appearance: alert, cooperative, appears stated age and no distress Resp: clear to auscultation bilaterally Cardio: regular rate and rhythm, S1, S2 normal, no murmur, click, rub or gallop GI: Nontender, nondistended. Bowel sounds are present. Suprapubic catheter noted. Extremities: extremities normal, atraumatic, no cyanosis  or edema  DISPOSITION: Home  Discharge Instructions    Call MD for:  difficulty breathing, headache or visual disturbances    Complete by:  As directed    Call MD for:  extreme fatigue    Complete by:  As directed    Call MD for:  persistant dizziness or light-headedness    Complete by:  As directed    Call MD for:  persistant nausea and vomiting    Complete by:  As directed    Call MD for:  severe uncontrolled pain    Complete by:  As directed    Call MD for:  temperature >100.4    Complete by:  As directed    Diet Carb Modified    Complete by:  As directed    Discharge instructions    Complete by:  As directed    Dr. Annabell Howells will arrange outpatient follow up. Please be sure to see your PCP within a week regarding your diabetes. You will benefit from being on insulin which you have declined for now. If you reconsider, please talk to your PCP about it. Please take your meds as prescribed.   You were cared for by a hospitalist during your hospital stay. If you have any questions about your discharge medications or the care you received while you were in the hospital after you are discharged, you can call the unit and asked to speak with the hospitalist on call if  the hospitalist that took care of you is not available. Once you are discharged, your primary care physician will handle any further medical issues. Please note that NO REFILLS for any discharge medications will be authorized once you are discharged, as it is imperative that you return to your primary care physician (or establish a relationship with a primary care physician if you do not have one) for your aftercare needs so that they can reassess your need for medications and monitor your lab values. If you do not have a primary care physician, you can call [phone] for a physician referral.   Increase activity slowly    Complete by:  As directed       ALLERGIES:  Allergies  Allergen Reactions  . Other Other (See Comments)    Coconut-- n/v      Current Discharge Medication List    START taking these medications   Details  cefpodoxime (VANTIN) 200 MG tablet Take 1 tablet (200 mg total) by mouth every 12 (twelve) hours. For 1 more week. Qty: 14 tablet, Refills: 0    lisinopril (PRINIVIL,ZESTRIL) 10 MG tablet Take 1 tablet (10 mg total) by mouth daily. Qty: 30 tablet, Refills: 0      CONTINUE these medications which have CHANGED   Details  HYDROcodone-acetaminophen (NORCO) 5-325 MG tablet Take 1 tablet by mouth every 6 (six) hours as needed for moderate pain. Qty: 12 tablet, Refills: 0    metFORMIN (GLUCOPHAGE) 1000 MG tablet Take 1 tablet (1,000 mg total) by mouth 2 (two) times daily with a meal. Qty: 60 tablet, Refills: 0      CONTINUE these medications which have NOT CHANGED   Details  ibuprofen (ADVIL,MOTRIN) 200 MG tablet Take 600 mg by mouth every 6 (six) hours as needed for headache or moderate pain.      STOP taking these medications     phenazopyridine (PYRIDIUM) 200 MG tablet          Follow-up Information    OSEI-BONSU,GEORGE, MD. Schedule an appointment as soon  as possible for a visit in 1 week(s).   Specialty:  Internal Medicine Contact information: [address] [place] [address] [phone]        Anner Crete, MD Follow up.   Specialty:  Urology Why:  his office will call with appointment. Contact information: [address] [place] [address] [phone]           TOTAL DISCHARGE TIME: 35 minutes  [facility]  [facility] Pager [phone]  01/31/2016, 4:00 PM

## 2016-02-01 LAB — CULTURE, BLOOD (ROUTINE X 2)
Culture: NO GROWTH
Culture: NO GROWTH

## 2016-02-20 ENCOUNTER — Other Ambulatory Visit: Payer: Self-pay | Admitting: Urology

## 2016-02-20 NOTE — Progress Notes (Signed)
Pt is being scheduled for preop appt; please place surgical orders in epic. Thanks.  

## 2016-02-20 NOTE — Progress Notes (Signed)
Routed A1C results in epic from 01/27/2016 to Dr Marlou PorchHerrick

## 2016-02-21 ENCOUNTER — Encounter (INDEPENDENT_AMBULATORY_CARE_PROVIDER_SITE_OTHER): Payer: Self-pay

## 2016-02-21 ENCOUNTER — Encounter (HOSPITAL_COMMUNITY)
Admission: RE | Admit: 2016-02-21 | Discharge: 2016-02-21 | Disposition: A | Discharge status: Home / Self Care | Payer: BLUE CROSS/BLUE SHIELD | Source: Ambulatory Visit | Attending: Urology | Admitting: Urology

## 2016-02-21 ENCOUNTER — Encounter (HOSPITAL_COMMUNITY): Payer: Self-pay

## 2016-02-21 DIAGNOSIS — Z01812 Encounter for preprocedural laboratory examination: Secondary | ICD-10-CM | POA: Diagnosis present

## 2016-02-21 DIAGNOSIS — I10 Essential (primary) hypertension: Secondary | ICD-10-CM | POA: Insufficient documentation

## 2016-02-21 DIAGNOSIS — R6521 Severe sepsis with septic shock: Secondary | ICD-10-CM | POA: Insufficient documentation

## 2016-02-21 DIAGNOSIS — E669 Obesity, unspecified: Secondary | ICD-10-CM | POA: Insufficient documentation

## 2016-02-21 DIAGNOSIS — E1169 Type 2 diabetes mellitus with other specified complication: Secondary | ICD-10-CM | POA: Diagnosis not present

## 2016-02-21 DIAGNOSIS — N358 Other urethral stricture: Secondary | ICD-10-CM | POA: Diagnosis not present

## 2016-02-21 DIAGNOSIS — N390 Urinary tract infection, site not specified: Secondary | ICD-10-CM | POA: Insufficient documentation

## 2016-02-21 HISTORY — DX: Essential (primary) hypertension: I10

## 2016-02-21 HISTORY — DX: Angina pectoris, unspecified: I20.9

## 2016-02-21 HISTORY — DX: Bronchitis, not specified as acute or chronic: J40

## 2016-02-21 HISTORY — DX: Attention-deficit hyperactivity disorder, unspecified type: F90.9

## 2016-02-21 HISTORY — DX: Unspecified asthma, uncomplicated: J45.909

## 2016-02-21 LAB — GLUCOSE, CAPILLARY: Glucose-Capillary: 251 mg/dL — ABNORMAL HIGH (ref 65–99)

## 2016-02-21 NOTE — Patient Instructions (Addendum)
Caleb Sharp  02/21/2016   Your procedure is scheduled on: 02/26/16  [facility]  [facility] to 3rd floor to  Short Stay Center at 5:30 AM.  Call this number if you have problems the morning of surgery [phone]   Remember: ONLY 1 PERSON MAY GO WITH YOU TO SHORT STAY TO GET  READY MORNING OF YOUR SURGERY.  Do not eat food or drink liquids :After Midnight.     Take these medicines the morning of surgery with A SIP OF WATER: Mirabegron (Myrbetriq) DO NOT TAKE ANY DIABETIC MEDICATIONS DAY OF YOUR SURGERY  How to Manage Your Diabetes Before and After Surgery  Why is it important to control my blood sugar before and after surgery? . Improving blood sugar levels before and after surgery helps healing and can limit problems. . A way of improving blood sugar control is eating a healthy diet by: o  Eating less sugar and carbohydrates o  Increasing activity/exercise o  Talking with your doctor about reaching your blood sugar goals . High blood sugars (greater than 180 mg/dL) can raise your risk of infections and slow your recovery, so you will need to focus on controlling your diabetes during the weeks before surgery. . Make sure that the doctor who takes care of your diabetes knows about your planned surgery including the date and location.  How do I manage my blood sugar before surgery? . Check your blood sugar at least 4 times a day, starting 2 days before surgery, to make sure that the level is not too high or low. o Check your blood sugar the morning of your surgery when you wake up and every 2 hours until you get to the Short Stay unit. . If your blood sugar is less than 70 mg/dL, you will need to treat for low blood sugar: o Do not take insulin. o Treat a low blood sugar (less than 70 mg/dL) with  cup of clear juice (cranberry or apple), 4 glucose tablets, OR glucose gel. o Recheck blood sugar in 15 minutes after treatment  (to make sure it is greater than 70 mg/dL). If your blood sugar is not greater than 70 mg/dL on recheck, call [phone] for further instructions. . Report your blood sugar to the short stay nurse when you get to Short Stay.  . If you are admitted to the hospital after surgery: o Your blood sugar will be checked by the staff and you will probably be given insulin after surgery (instead of oral diabetes medicines) to make sure you have good blood sugar levels. o The goal for blood sugar control after surgery is 80-180 mg/dL.   WHAT DO I DO ABOUT MY DIABETES MEDICATION?  Marland Kitchen. Do not take oral diabetes medicines (pills) the morning of surgery.   Patient Signature:  Date:   Nurse Signature:  Date:   Reviewed and Endorsed by [facility] Patient Education Committee, August 2015                               You may not have any metal on your body including hair pins and              piercings  Do not wear jewelry, make-up, lotions, powders or perfumes, deodorant  Do not wear nail polish.  Do not shave  48 hours prior to surgery.              Men may shave face and neck.   Do not bring valuables to the hospital. [facility] IS NOT             RESPONSIBLE   FOR VALUABLES.  Contacts, dentures or bridgework may not be worn into surgery.  Leave suitcase in the car. After surgery it may be brought to your room.               Please read over the following fact sheets you were given: _____________________________________________________________________             [facility] - Preparing for Surgery Before surgery, you can play an important role.  Because skin is not sterile, your skin needs to be as free of germs as possible.  You can reduce the number of germs on your skin by washing with CHG (chlorahexidine gluconate) soap before surgery.  CHG is an antiseptic cleaner which kills germs and bonds with the skin to continue killing germs even after washing. Please DO NOT use if you  have an allergy to CHG or antibacterial soaps.  If your skin becomes reddened/irritated stop using the CHG and inform your nurse when you arrive at Short Stay. Do not shave (including legs and underarms) for at least 48 hours prior to the first CHG shower.  You may shave your face/neck. Please follow these instructions carefully:  1.  Shower with CHG Soap the night before surgery and the  morning of Surgery.  2.  If you choose to wash your hair, wash your hair first as usual with your  normal  shampoo.  3.  After you shampoo, rinse your hair and body thoroughly to remove the  shampoo.                           4.  Use CHG as you would any other liquid soap.  You can apply chg directly  to the skin and wash                       Gently with a scrungie or clean washcloth.  5.  Apply the CHG Soap to your body ONLY FROM THE NECK DOWN.   Do not use on face/ open                           Wound or open sores. Avoid contact with eyes, ears mouth and genitals (private parts).                       Wash face,  Genitals (private parts) with your normal soap.             6.  Wash thoroughly, paying special attention to the area where your surgery  will be performed.  7.  Thoroughly rinse your body with warm water from the neck down.  8.  DO NOT shower/wash with your normal soap after using and rinsing off  the CHG Soap.                9.  Pat yourself dry with a clean towel.            10.  Wear clean pajamas.  11.  Place clean sheets on your bed the night of your first shower and do not  sleep with pets. Day of Surgery : Do not apply any lotions/deodorants the morning of surgery.  Please wear clean clothes to the hospital/surgery center.  FAILURE TO FOLLOW THESE INSTRUCTIONS MAY RESULT IN THE CANCELLATION OF YOUR SURGERY PATIENT SIGNATURE_________________________________  NURSE  SIGNATURE__________________________________  ________________________________________________________________________

## 2016-02-21 NOTE — Pre-Procedure Instructions (Addendum)
CXR 01-27-16 epic EKG 01-28-16 epic CBC/diff, CMP 02-17-16 on chart Hgb A1C 02-17-16 on chart.  Result is 10.9. Forwarded to Dr. Marlou PorchHerrick.  LOV Dr. Julio Sickssei-Bonsu 02-17-16 on chart.  Pt states he was taking medication for his blood pressure but it ran out 3 weeks ago and he needs to call his doctor to have it refilled.

## 2016-02-21 NOTE — Progress Notes (Signed)
Pt Hgb A1C from [facility] appt 02/17/16 returned 10.9.  Pt's surgery scheduled for 02/26/16.  Thank you.

## 2016-02-25 ENCOUNTER — Other Ambulatory Visit: Payer: Self-pay | Admitting: Urology

## 2016-02-25 NOTE — Anesthesia Preprocedure Evaluation (Addendum)
Anesthesia Evaluation  Patient identified by MRN, date of birth, ID band Patient awake    Reviewed: Allergy & Precautions, H&P , NPO status , Patient's Chart, lab work & pertinent test results  Airway Mallampati: III  TM Distance: >3 FB Neck ROM: Full    Dental no notable dental hx. (+) Teeth Intact, Dental Advisory Given   Pulmonary asthma , former smoker,    Pulmonary exam normal breath sounds clear to auscultation       Cardiovascular Exercise Tolerance: Good hypertension, Pt. on medications  Rhythm:Regular Rate:Normal     Neuro/Psych negative neurological ROS  negative psych ROS   GI/Hepatic negative GI ROS, Neg liver ROS,   Endo/Other  diabetes, Type 2, Oral Hypoglycemic Agents  Renal/GU negative Renal ROS  negative genitourinary   Musculoskeletal   Abdominal   Peds  Hematology negative hematology ROS (+)   Anesthesia Other Findings   Reproductive/Obstetrics negative OB ROS                            Anesthesia Physical Anesthesia Plan  ASA: II  Anesthesia Plan: General   Post-op Pain Management:    Induction: Intravenous  Airway Management Planned: Oral ETT  Additional Equipment:   Intra-op Plan:   Post-operative Plan: Extubation in OR  Informed Consent: I have reviewed the patients History and Physical, chart, labs and discussed the procedure including the risks, benefits and alternatives for the proposed anesthesia with the patient or authorized representative who has indicated his/her understanding and acceptance.   Dental advisory given  Plan Discussed with: CRNA  Anesthesia Plan Comments:        Anesthesia Quick Evaluation

## 2016-02-26 ENCOUNTER — Encounter (HOSPITAL_COMMUNITY)
Admission: AD | Disposition: A | Discharge status: Home / Self Care | Payer: Self-pay | Source: Ambulatory Visit | Attending: Urology

## 2016-02-26 ENCOUNTER — Inpatient Hospital Stay (HOSPITAL_COMMUNITY): Payer: BLUE CROSS/BLUE SHIELD | Admitting: Anesthesiology

## 2016-02-26 ENCOUNTER — Inpatient Hospital Stay (HOSPITAL_COMMUNITY)
Admission: AD | Admit: 2016-02-26 | Discharge: 2016-02-27 | DRG: 672 | Disposition: A | Discharge status: Home / Self Care | Payer: BLUE CROSS/BLUE SHIELD | Source: Ambulatory Visit | Attending: Urology | Admitting: Urology

## 2016-02-26 ENCOUNTER — Encounter (HOSPITAL_COMMUNITY): Payer: Self-pay

## 2016-02-26 DIAGNOSIS — I10 Essential (primary) hypertension: Secondary | ICD-10-CM | POA: Diagnosis present

## 2016-02-26 DIAGNOSIS — Z87891 Personal history of nicotine dependence: Secondary | ICD-10-CM | POA: Diagnosis not present

## 2016-02-26 DIAGNOSIS — E119 Type 2 diabetes mellitus without complications: Secondary | ICD-10-CM | POA: Diagnosis present

## 2016-02-26 DIAGNOSIS — Z79899 Other long term (current) drug therapy: Secondary | ICD-10-CM

## 2016-02-26 DIAGNOSIS — R339 Retention of urine, unspecified: Secondary | ICD-10-CM | POA: Diagnosis present

## 2016-02-26 DIAGNOSIS — N359 Urethral stricture, unspecified: Secondary | ICD-10-CM | POA: Diagnosis present

## 2016-02-26 DIAGNOSIS — N99111 Postprocedural bulbous urethral stricture: Principal | ICD-10-CM | POA: Diagnosis present

## 2016-02-26 DIAGNOSIS — R3912 Poor urinary stream: Secondary | ICD-10-CM | POA: Diagnosis present

## 2016-02-26 DIAGNOSIS — Z7984 Long term (current) use of oral hypoglycemic drugs: Secondary | ICD-10-CM | POA: Diagnosis not present

## 2016-02-26 DIAGNOSIS — N35919 Unspecified urethral stricture, male, unspecified site: Secondary | ICD-10-CM | POA: Diagnosis present

## 2016-02-26 DIAGNOSIS — Z79891 Long term (current) use of opiate analgesic: Secondary | ICD-10-CM

## 2016-02-26 HISTORY — PX: URETHROPLASTY: SHX499

## 2016-02-26 LAB — URINALYSIS, ROUTINE W REFLEX MICROSCOPIC
Bilirubin Urine: NEGATIVE
Glucose, UA: NEGATIVE mg/dL
Ketones, ur: NEGATIVE mg/dL
Nitrite: POSITIVE — AB
Protein, ur: 100 mg/dL — AB
Specific Gravity, Urine: 1.018 (ref 1.005–1.030)
Squamous Epithelial / LPF: NONE SEEN
pH: 5 (ref 5.0–8.0)

## 2016-02-26 LAB — CBC
HCT: 47.2 % (ref 39.0–52.0)
HCT: 39.1 % (ref 39.0–52.0)
Hemoglobin: 16 g/dL (ref 13.0–17.0)
Hemoglobin: 13.1 g/dL (ref 13.0–17.0)
MCH: 29.9 pg (ref 26.0–34.0)
MCH: 29.8 pg (ref 26.0–34.0)
MCHC: 33.9 g/dL (ref 30.0–36.0)
MCHC: 33.5 g/dL (ref 30.0–36.0)
MCV: 88.1 fL (ref 78.0–100.0)
MCV: 89.1 fL (ref 78.0–100.0)
Platelets: 294 10*3/uL (ref 150–400)
Platelets: 250 10*3/uL (ref 150–400)
RBC: 5.36 MIL/uL (ref 4.22–5.81)
RBC: 4.39 MIL/uL (ref 4.22–5.81)
RDW: 13.6 % (ref 11.5–15.5)
RDW: 13.5 % (ref 11.5–15.5)
WBC: 9.9 10*3/uL (ref 4.0–10.5)
WBC: 9.8 10*3/uL (ref 4.0–10.5)

## 2016-02-26 LAB — GLUCOSE, CAPILLARY
Glucose-Capillary: 138 mg/dL — ABNORMAL HIGH (ref 65–99)
Glucose-Capillary: 216 mg/dL — ABNORMAL HIGH (ref 65–99)
Glucose-Capillary: 208 mg/dL — ABNORMAL HIGH (ref 65–99)
Glucose-Capillary: 191 mg/dL — ABNORMAL HIGH (ref 65–99)

## 2016-02-26 LAB — BASIC METABOLIC PANEL
Anion gap: 10 (ref 5–15)
Anion gap: 10 (ref 5–15)
BUN: 14 mg/dL (ref 6–20)
BUN: 13 mg/dL (ref 6–20)
CO2: 30 mmol/L (ref 22–32)
CO2: 27 mmol/L (ref 22–32)
Calcium: 9.7 mg/dL (ref 8.9–10.3)
Calcium: 8.3 mg/dL — ABNORMAL LOW (ref 8.9–10.3)
Chloride: 102 mmol/L (ref 101–111)
Chloride: 99 mmol/L — ABNORMAL LOW (ref 101–111)
Creatinine, Ser: 0.94 mg/dL (ref 0.61–1.24)
Creatinine, Ser: 1.17 mg/dL (ref 0.61–1.24)
GFR calc Af Amer: >60 mL/min (ref >60)
GFR calc Af Amer: >60 mL/min (ref >60)
GFR calc non Af Amer: >60 mL/min (ref >60)
GFR calc non Af Amer: >60 mL/min (ref >60)
Glucose, Bld: 151 mg/dL — ABNORMAL HIGH (ref 65–99)
Glucose, Bld: 230 mg/dL — ABNORMAL HIGH (ref 65–99)
Potassium: 4.4 mmol/L (ref 3.5–5.1)
Potassium: 4.5 mmol/L (ref 3.5–5.1)
Sodium: 142 mmol/L (ref 135–145)
Sodium: 136 mmol/L (ref 135–145)

## 2016-02-26 LAB — TYPE AND SCREEN
ABO/RH(D): A POS
Antibody Screen: NEGATIVE

## 2016-02-26 LAB — ABO/RH: ABO/RH(D): A POS

## 2016-02-26 SURGERY — URETHROPLASTY, USING PATCH GRAFT
Anesthesia: General

## 2016-02-26 MED ORDER — PHENYLEPHRINE HCL 10 MG/ML IJ SOLN
INTRAVENOUS | Status: DC | PRN
  Administered 2016-02-26: 15 ug/min via INTRAVENOUS

## 2016-02-26 MED ORDER — SUFENTANIL CITRATE 50 MCG/ML IV SOLN
INTRAVENOUS | Status: DC | PRN
  Administered 2016-02-26: 5 ug via INTRAVENOUS
  Administered 2016-02-26 (×2): 10 ug via INTRAVENOUS
  Administered 2016-02-26: 15 ug via INTRAVENOUS
  Administered 2016-02-26: 5 ug via INTRAVENOUS
  Administered 2016-02-26 (×2): 10 ug via INTRAVENOUS
  Administered 2016-02-26: 5 ug via INTRAVENOUS
  Administered 2016-02-26: 20 ug via INTRAVENOUS
  Administered 2016-02-26: 10 ug via INTRAVENOUS

## 2016-02-26 MED ORDER — ACETAMINOPHEN 10 MG/ML IV SOLN
INTRAVENOUS | Status: DC | PRN
  Administered 2016-02-26: 1000 mg via INTRAVENOUS

## 2016-02-26 MED ORDER — LIDOCAINE HCL (CARDIAC) 20 MG/ML IV SOLN
INTRAVENOUS | Status: DC | PRN
  Administered 2016-02-26: 25 mg via INTRATRACHEAL
  Administered 2016-02-26: 75 mg via INTRAVENOUS

## 2016-02-26 MED ORDER — CIPROFLOXACIN IN D5W 400 MG/200ML IV SOLN
400.0000 mg | Freq: Two times a day (BID) | INTRAVENOUS | Status: DC
  Administered 2016-02-26: 400 mg via INTRAVENOUS

## 2016-02-26 MED ORDER — LIDOCAINE HCL 2 % EX GEL
CUTANEOUS | Status: AC
  Filled 2016-02-26: qty 5

## 2016-02-26 MED ORDER — HYDROMORPHONE HCL 1 MG/ML IJ SOLN
INTRAMUSCULAR | Status: DC | PRN
  Administered 2016-02-26 (×3): 0.5 mg via INTRAVENOUS

## 2016-02-26 MED ORDER — MIDAZOLAM HCL 5 MG/5ML IJ SOLN
INTRAMUSCULAR | Status: DC | PRN
  Administered 2016-02-26 (×2): 1 mg via INTRAVENOUS
  Administered 2016-02-26: 2 mg via INTRAVENOUS

## 2016-02-26 MED ORDER — BENZOCAINE (TOPICAL) 20 % EX AERO
INHALATION_SPRAY | Freq: Four times a day (QID) | CUTANEOUS | Status: DC | PRN
  Administered 2016-02-26: 1 via OROMUCOSAL
  Filled 2016-02-26: qty 57

## 2016-02-26 MED ORDER — SODIUM CHLORIDE 0.9 % IJ SOLN
INTRAMUSCULAR | Status: AC
  Filled 2016-02-26: qty 10

## 2016-02-26 MED ORDER — ONDANSETRON HCL 4 MG/2ML IJ SOLN
INTRAMUSCULAR | Status: DC | PRN
  Administered 2016-02-26: 4 mg via INTRAVENOUS

## 2016-02-26 MED ORDER — BUPIVACAINE HCL (PF) 0.25 % IJ SOLN
INTRAMUSCULAR | Status: DC | PRN
  Administered 2016-02-26: 20 mL

## 2016-02-26 MED ORDER — SUFENTANIL CITRATE 50 MCG/ML IV SOLN
INTRAVENOUS | Status: AC
  Filled 2016-02-26: qty 1

## 2016-02-26 MED ORDER — ACETAMINOPHEN 10 MG/ML IV SOLN
1000.0000 mg | Freq: Four times a day (QID) | INTRAVENOUS | Status: DC
  Administered 2016-02-26 – 2016-02-27 (×3): 1000 mg via INTRAVENOUS
  Filled 2016-02-26 (×4): qty 100

## 2016-02-26 MED ORDER — MIRABEGRON ER 50 MG PO TB24
50.0000 mg | ORAL_TABLET | Freq: Every day | ORAL | Status: DC
  Administered 2016-02-26 – 2016-02-27 (×2): 50 mg via ORAL
  Filled 2016-02-26 (×3): qty 1

## 2016-02-26 MED ORDER — SUGAMMADEX SODIUM 200 MG/2ML IV SOLN
INTRAVENOUS | Status: AC
  Filled 2016-02-26: qty 2

## 2016-02-26 MED ORDER — CEFAZOLIN SODIUM-DEXTROSE 2-4 GM/100ML-% IV SOLN
2.0000 g | Freq: Once | INTRAVENOUS | Status: AC
  Administered 2016-02-26: 2 g via INTRAVENOUS
  Filled 2016-02-26: qty 100

## 2016-02-26 MED ORDER — BOOST / RESOURCE BREEZE PO LIQD
1.0000 | Freq: Three times a day (TID) | ORAL | Status: DC
  Administered 2016-02-26: 1 via ORAL

## 2016-02-26 MED ORDER — INSULIN ASPART 100 UNIT/ML ~~LOC~~ SOLN
0.0000 [IU] | Freq: Three times a day (TID) | SUBCUTANEOUS | Status: DC
  Administered 2016-02-26 (×2): 5 [IU] via SUBCUTANEOUS
  Administered 2016-02-27: 2 [IU] via SUBCUTANEOUS

## 2016-02-26 MED ORDER — HYDROMORPHONE HCL 1 MG/ML IJ SOLN
0.2500 mg | INTRAMUSCULAR | Status: DC | PRN
  Administered 2016-02-26 (×2): 0.5 mg via INTRAVENOUS

## 2016-02-26 MED ORDER — MAGIC MOUTHWASH W/LIDOCAINE
10.0000 mL | Freq: Three times a day (TID) | ORAL | Status: DC
  Administered 2016-02-26 – 2016-02-27 (×2): 10 mL via ORAL
  Filled 2016-02-26 (×3): qty 10

## 2016-02-26 MED ORDER — PHENYLEPHRINE 40 MCG/ML (10ML) SYRINGE FOR IV PUSH (FOR BLOOD PRESSURE SUPPORT)
PREFILLED_SYRINGE | INTRAVENOUS | Status: AC
  Filled 2016-02-26: qty 20

## 2016-02-26 MED ORDER — CIPROFLOXACIN HCL 500 MG PO TABS: 500.0000 mg | ORAL_TABLET | Freq: Two times a day (BID) | ORAL | 0 refills | Status: DC

## 2016-02-26 MED ORDER — ONDANSETRON HCL 4 MG/2ML IJ SOLN
INTRAMUSCULAR | Status: AC
  Filled 2016-02-26: qty 2

## 2016-02-26 MED ORDER — PHENYLEPHRINE HCL 10 MG/ML IJ SOLN
INTRAMUSCULAR | Status: DC | PRN
  Administered 2016-02-26 (×3): 80 ug via INTRAVENOUS
  Administered 2016-02-26 (×2): 120 ug via INTRAVENOUS
  Administered 2016-02-26: 80 ug via INTRAVENOUS

## 2016-02-26 MED ORDER — BUPIVACAINE HCL (PF) 0.25 % IJ SOLN
INTRAMUSCULAR | Status: AC
  Filled 2016-02-26: qty 30

## 2016-02-26 MED ORDER — ARTIFICIAL TEARS OP OINT
TOPICAL_OINTMENT | OPHTHALMIC | Status: AC
  Filled 2016-02-26: qty 3.5

## 2016-02-26 MED ORDER — LIDOCAINE 2% (20 MG/ML) 5 ML SYRINGE
INTRAMUSCULAR | Status: AC
  Filled 2016-02-26: qty 5

## 2016-02-26 MED ORDER — ENOXAPARIN SODIUM 40 MG/0.4ML ~~LOC~~ SOLN
40.0000 mg | SUBCUTANEOUS | Status: DC
  Administered 2016-02-27: 40 mg via SUBCUTANEOUS
  Filled 2016-02-26: qty 0.4

## 2016-02-26 MED ORDER — ENSURE ENLIVE PO LIQD
237.0000 mL | Freq: Two times a day (BID) | ORAL | Status: DC
  Administered 2016-02-27: 237 mL via ORAL

## 2016-02-26 MED ORDER — LIDOCAINE-EPINEPHRINE (PF) 1 %-1:200000 IJ SOLN
INTRAMUSCULAR | Status: DC | PRN
  Administered 2016-02-26: 12 mL

## 2016-02-26 MED ORDER — CIPROFLOXACIN HCL 500 MG PO TABS
500.0000 mg | ORAL_TABLET | Freq: Two times a day (BID) | ORAL | Status: DC
  Administered 2016-02-26 – 2016-02-27 (×2): 500 mg via ORAL
  Filled 2016-02-26 (×2): qty 1

## 2016-02-26 MED ORDER — DOCUSATE SODIUM 100 MG PO CAPS: 100.0000 mg | ORAL_CAPSULE | Freq: Two times a day (BID) | ORAL | 0 refills | Status: DC | PRN

## 2016-02-26 MED ORDER — LISINOPRIL 10 MG PO TABS
10.0000 mg | ORAL_TABLET | Freq: Every day | ORAL | Status: DC
  Administered 2016-02-26 – 2016-02-27 (×2): 10 mg via ORAL
  Filled 2016-02-26 (×2): qty 1

## 2016-02-26 MED ORDER — LIDOCAINE-EPINEPHRINE (PF) 1 %-1:200000 IJ SOLN
INTRAMUSCULAR | Status: AC
  Filled 2016-02-26: qty 30

## 2016-02-26 MED ORDER — INSULIN ASPART 100 UNIT/ML ~~LOC~~ SOLN
SUBCUTANEOUS | Status: AC
  Filled 2016-02-26: qty 1

## 2016-02-26 MED ORDER — CIPROFLOXACIN IN D5W 400 MG/200ML IV SOLN
INTRAVENOUS | Status: AC
  Filled 2016-02-26: qty 200

## 2016-02-26 MED ORDER — LACTATED RINGERS IV SOLN
INTRAVENOUS | Status: DC
  Administered 2016-02-26 – 2016-02-27 (×2): via INTRAVENOUS

## 2016-02-26 MED ORDER — OXYCODONE HCL 5 MG PO TABS: 5.0000 mg | ORAL_TABLET | ORAL | 0 refills | Status: DC | PRN

## 2016-02-26 MED ORDER — DOCUSATE SODIUM 100 MG PO CAPS
100.0000 mg | ORAL_CAPSULE | Freq: Two times a day (BID) | ORAL | Status: DC
  Administered 2016-02-26 – 2016-02-27 (×2): 100 mg via ORAL
  Filled 2016-02-26 (×2): qty 1

## 2016-02-26 MED ORDER — LACTATED RINGERS IV SOLN
INTRAVENOUS | Status: DC | PRN
  Administered 2016-02-26 (×4): via INTRAVENOUS

## 2016-02-26 MED ORDER — METFORMIN HCL 500 MG PO TABS
1000.0000 mg | ORAL_TABLET | Freq: Two times a day (BID) | ORAL | Status: DC
  Administered 2016-02-26 – 2016-02-27 (×2): 1000 mg via ORAL
  Filled 2016-02-26 (×2): qty 2

## 2016-02-26 MED ORDER — SUGAMMADEX SODIUM 200 MG/2ML IV SOLN
INTRAVENOUS | Status: DC | PRN
  Administered 2016-02-26: 200 mg via INTRAVENOUS

## 2016-02-26 MED ORDER — BELLADONNA ALKALOIDS-OPIUM 16.2-60 MG RE SUPP: 1.0000 | Freq: Four times a day (QID) | RECTAL | Status: DC | PRN

## 2016-02-26 MED ORDER — OXYCODONE HCL 5 MG PO TABS
5.0000 mg | ORAL_TABLET | ORAL | Status: DC | PRN
  Administered 2016-02-27: 5 mg via ORAL
  Filled 2016-02-26: qty 1

## 2016-02-26 MED ORDER — GENTAMICIN SULFATE 40 MG/ML IJ SOLN
INTRAVENOUS | Status: DC | PRN
  Administered 2016-02-26: 270 mg via INTRAVENOUS

## 2016-02-26 MED ORDER — 0.9 % SODIUM CHLORIDE (POUR BTL) OPTIME
TOPICAL | Status: DC | PRN
  Administered 2016-02-26: 1000 mL

## 2016-02-26 MED ORDER — DEXAMETHASONE SODIUM PHOSPHATE 10 MG/ML IJ SOLN
INTRAMUSCULAR | Status: AC
  Filled 2016-02-26: qty 1

## 2016-02-26 MED ORDER — LABETALOL HCL 5 MG/ML IV SOLN
INTRAVENOUS | Status: DC | PRN
  Administered 2016-02-26 (×2): 5 mg via INTRAVENOUS

## 2016-02-26 MED ORDER — HYDROMORPHONE HCL 1 MG/ML IJ SOLN
INTRAMUSCULAR | Status: AC
  Filled 2016-02-26: qty 1

## 2016-02-26 MED ORDER — DEXAMETHASONE SODIUM PHOSPHATE 10 MG/ML IJ SOLN
INTRAMUSCULAR | Status: DC | PRN
  Administered 2016-02-26: 10 mg via INTRAVENOUS

## 2016-02-26 MED ORDER — ROCURONIUM BROMIDE 50 MG/5ML IV SOSY
PREFILLED_SYRINGE | INTRAVENOUS | Status: AC
  Filled 2016-02-26: qty 5

## 2016-02-26 MED ORDER — MIDAZOLAM HCL 2 MG/2ML IJ SOLN
INTRAMUSCULAR | Status: AC
  Filled 2016-02-26: qty 2

## 2016-02-26 MED ORDER — KETOROLAC TROMETHAMINE 15 MG/ML IJ SOLN
15.0000 mg | Freq: Four times a day (QID) | INTRAMUSCULAR | Status: DC
  Administered 2016-02-26 – 2016-02-27 (×3): 15 mg via INTRAVENOUS
  Filled 2016-02-26 (×3): qty 1

## 2016-02-26 MED ORDER — PROPOFOL 10 MG/ML IV BOLUS
INTRAVENOUS | Status: AC
  Filled 2016-02-26: qty 40

## 2016-02-26 MED ORDER — PHENYLEPHRINE HCL 10 MG/ML IJ SOLN
INTRAMUSCULAR | Status: AC
  Filled 2016-02-26: qty 1

## 2016-02-26 MED ORDER — ROCURONIUM BROMIDE 100 MG/10ML IV SOLN
INTRAVENOUS | Status: DC | PRN
  Administered 2016-02-26: 20 mg via INTRAVENOUS
  Administered 2016-02-26: 50 mg via INTRAVENOUS
  Administered 2016-02-26: 10 mg via INTRAVENOUS
  Administered 2016-02-26: 20 mg via INTRAVENOUS

## 2016-02-26 MED ORDER — LABETALOL HCL 5 MG/ML IV SOLN
INTRAVENOUS | Status: AC
  Filled 2016-02-26: qty 4

## 2016-02-26 MED ORDER — MIDAZOLAM HCL 2 MG/2ML IJ SOLN
INTRAMUSCULAR | Status: AC
Start: 2016-02-26 — End: 2016-02-26
  Filled 2016-02-26: qty 2

## 2016-02-26 MED ORDER — GENTAMICIN SULFATE 40 MG/ML IJ SOLN
2.5000 mg/kg | Freq: Once | INTRAVENOUS | Status: DC
  Filled 2016-02-26: qty 5.75

## 2016-02-26 MED ORDER — ACETAMINOPHEN 10 MG/ML IV SOLN
INTRAVENOUS | Status: AC
  Filled 2016-02-26: qty 100

## 2016-02-26 MED ORDER — ONDANSETRON HCL 4 MG/2ML IJ SOLN: 4.0000 mg | INTRAMUSCULAR | Status: DC | PRN

## 2016-02-26 MED ORDER — STERILE WATER FOR IRRIGATION IR SOLN
Status: DC | PRN
  Administered 2016-02-26: 1000 mL

## 2016-02-26 MED ORDER — CEFAZOLIN SODIUM-DEXTROSE 2-4 GM/100ML-% IV SOLN
INTRAVENOUS | Status: AC
  Filled 2016-02-26: qty 100

## 2016-02-26 MED ORDER — PROPOFOL 10 MG/ML IV BOLUS
INTRAVENOUS | Status: DC | PRN
  Administered 2016-02-26: 200 mg via INTRAVENOUS
  Administered 2016-02-26: 30 mg via INTRAVENOUS
  Administered 2016-02-26: 100 mg via INTRAVENOUS

## 2016-02-26 MED ORDER — HYDROMORPHONE HCL 2 MG/ML IJ SOLN
INTRAMUSCULAR | Status: AC
  Filled 2016-02-26: qty 1

## 2016-02-26 SURGICAL SUPPLY — 64 items
BAG URINE DRAINAGE (UROLOGICAL SUPPLIES) ×2 IMPLANT
BANDAGE COBAN STERILE 2 (GAUZE/BANDAGES/DRESSINGS) IMPLANT
BLADE HEX COATED 2.75 (ELECTRODE) ×2 IMPLANT
BLADE SURG 15 STRL LF DISP TIS (BLADE) ×2 IMPLANT
BLADE SURG 15 STRL SS (BLADE) ×2
BLADE SURG SZ10 CARB STEEL (BLADE) ×2 IMPLANT
BNDG GAUZE ELAST 4 BULKY (GAUZE/BANDAGES/DRESSINGS) ×2 IMPLANT
BRIEF STRETCH FOR OB PAD LRG (UNDERPADS AND DIAPERS) ×2 IMPLANT
CATH FOLEY 2WAY SLVR  5CC 14FR (CATHETERS)
CATH FOLEY 2WAY SLVR  5CC 18FR (CATHETERS)
CATH FOLEY 2WAY SLVR 5CC 14FR (CATHETERS) IMPLANT
CATH FOLEY 2WAY SLVR 5CC 18FR (CATHETERS) IMPLANT
CATH INTERMIT  6FR 70CM (CATHETERS) ×2 IMPLANT
CATH ROBINSON RED A/P 16FR (CATHETERS) ×2 IMPLANT
CATH ROBINSON RED A/P 18FR (CATHETERS) ×2 IMPLANT
CATH ROBINSON RED A/P 20FR (CATHETERS) ×2 IMPLANT
CATH SILASTIC FOLEY 18FRX5CC (CATHETERS) ×2 IMPLANT
DERMABOND ADVANCED (GAUZE/BANDAGES/DRESSINGS) ×1
DERMABOND ADVANCED .7 DNX12 (GAUZE/BANDAGES/DRESSINGS) ×1 IMPLANT
DRAIN PENROSE 18X1/4 LTX STRL (WOUND CARE) ×2 IMPLANT
DRAPE SHEET LG 3/4 BI-LAMINATE (DRAPES) ×6 IMPLANT
DRAPE SURG IRRIG POUCH 19X23 (DRAPES) ×2 IMPLANT
DRSG TELFA 3X8 NADH (GAUZE/BANDAGES/DRESSINGS) IMPLANT
ELECT PENCIL ROCKER SW 15FT (MISCELLANEOUS) ×4 IMPLANT
GAUZE PACKING 2X5 YD STRL (GAUZE/BANDAGES/DRESSINGS) IMPLANT
GAUZE SPONGE 4X4 12PLY STRL (GAUZE/BANDAGES/DRESSINGS) ×2 IMPLANT
GAUZE SPONGE 4X4 16PLY XRAY LF (GAUZE/BANDAGES/DRESSINGS) ×10 IMPLANT
GLOVE BIOGEL M STRL SZ7.5 (GLOVE) ×8 IMPLANT
GOWN STRL REUS W/TWL XL LVL3 (GOWN DISPOSABLE) ×6 IMPLANT
GUIDEWIRE STR DUAL SENSOR (WIRE) ×2 IMPLANT
HOLDER FOLEY CATH W/STRAP (MISCELLANEOUS) IMPLANT
KIT BASIN OR (CUSTOM PROCEDURE TRAY) ×2 IMPLANT
LOOP VESSEL MAXI BLUE (MISCELLANEOUS) ×2 IMPLANT
NEEDLE HYPO 25X1 1.5 SAFETY (NEEDLE) ×16 IMPLANT
PACK CYSTO (CUSTOM PROCEDURE TRAY) ×2 IMPLANT
PAK SCROTO (SET/KITS/TRAYS/PACK) ×2 IMPLANT
PLUG CATH AND CAP STER (CATHETERS) ×2 IMPLANT
SHEET LAVH (DRAPES) ×2 IMPLANT
SPONGE LAP 4X18 X RAY DECT (DISPOSABLE) ×2 IMPLANT
STRIP CLOSURE SKIN 1/2X4 (GAUZE/BANDAGES/DRESSINGS) IMPLANT
SUT CHROMIC 2 0 SH (SUTURE) ×4 IMPLANT
SUT CHROMIC 3 0 PS 2 (SUTURE) IMPLANT
SUT CHROMIC 3 0 SH 27 (SUTURE) IMPLANT
SUT CHROMIC 4 0 PS 2 18 (SUTURE) ×2 IMPLANT
SUT CHROMIC 4 0 SH 27 (SUTURE) ×4 IMPLANT
SUT MNCRL AB 4-0 PS2 18 (SUTURE) ×2 IMPLANT
SUT SILK 2 0 30  PSL (SUTURE) ×2
SUT SILK 2 0 30 PSL (SUTURE) ×2 IMPLANT
SUT SILK 3 0 SH CR/8 (SUTURE) ×2 IMPLANT
SUT SILK 4 0 SH CR/8 (SUTURE) IMPLANT
SUT VIC AB 2-0 SH 27 (SUTURE)
SUT VIC AB 2-0 SH 27X BRD (SUTURE) IMPLANT
SUT VIC AB 3-0 SH 27 (SUTURE) ×4
SUT VIC AB 3-0 SH 27XBRD (SUTURE) ×4 IMPLANT
SUT VIC AB 4-0 RB1 27 (SUTURE)
SUT VIC AB 4-0 RB1 27XBRD (SUTURE) IMPLANT
SUT VIC AB 4-0 SH 27 (SUTURE) ×12
SUT VIC AB 4-0 SH 27XBRD (SUTURE) ×12 IMPLANT
SYR 10ML LL (SYRINGE) ×4 IMPLANT
SYRINGE 60CC LL (MISCELLANEOUS) ×2 IMPLANT
TAPE CLOTH SURG 4X10 WHT LF (GAUZE/BANDAGES/DRESSINGS) ×2 IMPLANT
TUBING CONNECTING 10 (TUBING) ×2 IMPLANT
WATER STERILE IRR 1500ML POUR (IV SOLUTION) IMPLANT
YANKAUER SUCT BULB TIP 10FT TU (MISCELLANEOUS) ×2 IMPLANT

## 2016-02-26 NOTE — H&P (Signed)
Acute urinary retention  HPI: Caleb Sharp is a 30 year-old male established patient who is here for further eval and management of urinary retention.  It was first diagnosed 06/18/2015.   He currently has an indwelling catheter. The catheter has been in his bladder for approximately 01/20/2016. His urinary retention is being treated with suprapubic tube.   He does not have a good size and strength to his urinary stream. Prior to this episode, he had to bear down/strain to get his stream started. He is having problems with emptying his bladder well. He is not having problems with urinary control or incontinence.   His current symptoms are not related to undergoing a surgical procedure. The patient has not had prostate surgery in the past.   the patient's symptoms first began in April 2017. He presented to the emergency department where he was in urinary retention following progression of his weak stream and incomplete bladder emptying. In the emergency department a Foley catheter was easily passedand the patient was given follow-up with Dr. Isabel CapriceGrapey. because he was also having persistent hematuria he was taken to the operating room for an exam under anesthesia and cystoscopic evaluation. His workup was largely negative at this time, there was no evidence of ureteral stricture or significant ureteral trauma. His Foley catheter was removed and he was able to void. His symptoms were good for several months and then he progressively developed worsening symptoms. He then again presented to the emergency department where he wasquickly taken to the operating room by Dr. Wilson SingerWren. In the operating room the patient hadwhat sounds like a false passage. Dr. Wilson SingerWren was unable to find the true lumen of the patient's urethra. He subsequently underwent a suprapubic tube insertion. He then re-presented to theemergency department septic because of and occluded catheter. During that hospital admission the patient underwent a  retrograde urethrogram and a cystogram. 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   Currently, the patient is being managed with a suprapubic catheter. He is eager to proceed with definitive management. He has done his own research, inquires about urethroplasty with buccal graft.   The patient has refused to undergo cystoscopic evaluation today in clinic.     ALLERGIES: No Allergies    MEDICATIONS: Lisinopril 10 mg tablet  Hydrocodone-Acetaminophen  MetFORMIN HCl - 1000 MG Oral Tablet Oral     GU PSH: Cystoscopy - 12/31/2015 Cystoscopy Fulguration - 06/26/2015      PSH Notes: Cystoscopy With Fulguration, No Surgical Problems   NON-GU PSH: None   GU PMH: Gross hematuria, Hematuria, gross - 06/12/2015 Urinary Retention, Unspec, Retention, urine - 06/12/2015, Bladder retention of urine, - 06/12/2015    NON-GU PMH: Personal history of other endocrine, nutritional and metabolic disease, History of diabetes mellitus - 06/12/2015 Encounter for general adult medical examination without abnormal findings, Encounter for preventive health examination    FAMILY HISTORY: No pertinent family history - Runs In Family   SOCIAL HISTORY: Marital Status: Single     Notes: Occupation, Alcohol use, Number of children, Former smoker, Caffeine use   REVIEW OF SYSTEMS:    GU Review Male:   Patient reports burning/ pain with urination and get up at night to urinate. Patient denies frequent urination, hard to postpone urination, leakage of urine, stream starts and stops, trouble starting your stream, have to strain to urinate , erection problems, and penile pain.  Gastrointestinal (Upper):   Patient denies nausea, vomiting, and indigestion/ heartburn.  Gastrointestinal (Lower):   Patient denies diarrhea  and constipation.  Constitutional:   Patient reports night sweats. Patient denies fever, weight loss, and fatigue.  Skin:   Patient denies skin rash/ lesion and itching.  Eyes:   Patient denies  blurred vision and double vision.  Ears/ Nose/ Throat:   Patient denies sore throat and sinus problems.  Hematologic/Lymphatic:   Patient denies swollen glands and easy bruising.  Cardiovascular:   Patient reports chest pains. Patient denies leg swelling.  Respiratory:   Patient denies cough and shortness of breath.  Endocrine:   Patient denies excessive thirst.  Musculoskeletal:   Patient denies back pain and joint pain.  Neurological:   Patient reports dizziness. Patient denies headaches.  Psychologic:   Patient reports depression. Patient denies anxiety.   VITAL SIGNS:      02/17/2016 11:42 AM  Weight 197 lb / 89.36 kg  Height 67 in / 170.18 cm  BP 132/89 mmHg  Pulse 92 /min  Temperature 97.3 F / 36 C  BMI 30.9 kg/m   GU PHYSICAL EXAMINATION:    Anus and Perineum: No hemorrhoids. No anal stenosis. No rectal fissure, no anal fissure. No edema, no dimple, no perineal tenderness, no anal tenderness.  Scrotum: No lesions. No edema. No cysts. No warts.  Epididymides: Right: no spermatocele, no masses, no cysts, no tenderness, no induration, no enlargement. Left: no spermatocele, no masses, no cysts, no tenderness, no induration, no enlargement.  Testes: No tenderness, no swelling, no enlargement left testes. No tenderness, no swelling, no enlargement right testes. Normal location left testes. Normal location right testes. No mass, no cyst, no varicocele, no hydrocele left testes. No mass, no cyst, no varicocele, no hydrocele right testes.  Urethral Meatus: Normal size. No lesion, no wart, no discharge, no polyp. Normal location.  Penis: Circumcised, no warts, no cracks. No dorsal Peyronie's plaques, no left corporal Peyronie's plaques, no right corporal Peyronie's plaques, no scarring, no warts. No balanitis, no meatal stenosis.   MULTI-SYSTEM PHYSICAL EXAMINATION:    Constitutional: Well-nourished. No physical deformities. Normally developed. Good grooming.  Neck: Neck symmetrical, not  swollen. Normal tracheal position.  Respiratory: No labored breathing, no use of accessory muscles. Clear to auscultation bilaterally  Cardiovascular: Normal temperature, normal extremity pulses, no swelling, no varicosities. regular rate and rhythm  Lymphatic: No enlargement of neck, axillae, groin.  Skin: No paleness, no jaundice, no cyanosis. No lesion, no ulcer, no rash.  Neurologic / Psychiatric: Oriented to time, oriented to place, oriented to person. No depression, no anxiety, no agitation.  Gastrointestinal: No mass, no tenderness, no rigidity, non obese abdomen.  Eyes: Normal conjunctivae. Normal eyelids.  Ears, Nose, Mouth, and Throat: Left ear no scars, no lesions, no masses. Right ear no scars, no lesions, no masses. Nose no scars, no lesions, no masses. Normal hearing. Normal lips.  Musculoskeletal: Normal gait and station of head and neck.     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   I discussed with the patient management options which include going to the operating room and performing an exam under anesthesia and performing a balloon dilation. I told the patient that at best he had a 40% chance of success with this procedure, but given the history I suspected it was significantly lower than that. We also discussed primary reconstruction of his stricture I discussed the 2 most likely scenarios  including excision and primary anastomosis versus excision and placement of a buccal graft. I detailed both of these procedures with the patient in significant detail. The patient understands that in either procedure he has a significant chance of penile shortening. There is also a significant chance of failure. I also explained to the patient that following the procedure he would likely have both a suprapubic catheter as well as a Foley catheter. he understands that he will be admitted to the hospital for these one night. He also understands that over the course of several weeks we will work to get both the suprapubic and urethral catheter out. The patient understands that this would require at least a month off of work to fully recover from the procedure. In the meantime, I recommended the patient consider returning to work it light duty. This is a surgery that I will perform in conjunction with Dr. Alfredo Martinez. The patient is aware of this as well.

## 2016-02-26 NOTE — Anesthesia Procedure Notes (Signed)
Procedure Name: Intubation Date/Time: 02/26/2016 7:42 AM Performed by: Illene SilverEVANS, Jamoni Hewes E Pre-anesthesia Checklist: Patient identified, Emergency Drugs available, Suction available and Patient being monitored Patient Re-evaluated:Patient Re-evaluated prior to inductionOxygen Delivery Method: Circle system utilized Preoxygenation: Pre-oxygenation with 100% oxygen Intubation Type: IV induction Ventilation: Mask ventilation without difficulty Laryngoscope Size: Mac and 4 Grade View: Grade II Tube type: Oral Tube size: 8.0 mm Number of attempts: 1 Airway Equipment and Method: Stylet and Oral airway Placement Confirmation: ETT inserted through vocal cords under direct vision,  positive ETCO2 and breath sounds checked- equal and bilateral Secured at: 21 cm Tube secured with: Tape Dental Injury: Teeth and Oropharynx as per pre-operative assessment

## 2016-02-26 NOTE — Transfer of Care (Signed)
Immediate Anesthesia Transfer of Care Note  Patient: Caleb Sharp  Procedure(s) Performed: Procedure(s) with comments: URETHROPLASTY WITH  AUTOLOGOUS BUCCAL MUCOSAL GRAFT (N/A) - DR. MACDIARMID TO ASSIST  Patient Location: PACU  Anesthesia Type:General  Level of Consciousness: awake, alert , oriented and patient cooperative  Airway & Oxygen Therapy: Patient Spontanous Breathing and Patient connected to face mask oxygen  Post-op Assessment: Report given to RN, Post -op Vital signs reviewed and stable and Patient moving all extremities X 4  Post vital signs: stable  Last Vitals:  Vitals:   02/26/16 0530  BP: (!) 129/99  Pulse: 99  Resp: 18  Temp: 36.7 C    Last Pain:  Vitals:   02/26/16 0530  TempSrc: Oral         Complications: No apparent anesthesia complications

## 2016-02-26 NOTE — Op Note (Addendum)
Preoperative diagnosis:  1. 2 cm bulbar urethral stricture   Postoperative diagnosis:  1. Same   Procedure: 1. Dorsal onlay Urethroplasty with autologous buccal mucosa graft 2. Buccal graft mucosa  3. Cystourethroscopy  Co-Surgeon: Crist FatBenjamin W. Chera Slivka, MD Co-Surgeon: Alfredo MartinezScott MacDiarmid, M.D.  Anesthesia: General  Complications: None  Intraoperative findings: The patient had a long stricture measuring approximately 4 cm. Distal to the strictured area was abnormal mucosa just distal to the membranous urethra. The stricture was repaired using approximately 4 and a half centimeters of buccal graft.  EBL: 200  Specimens: None  Indication: Caleb Sharp is a 30 y.o. patient with recurrent dense bulbar urethral stricture.  After reviewing the management options for treatment, he elected to proceed with the above surgical procedure(s). We have discussed the potential benefits and risks of the procedure, side effects of the proposed treatment, the likelihood of the patient achieving the goals of the procedure, and any potential problems that might occur during the procedure or recuperation. Informed consent has been obtained.  Description of procedure: The use of co-surgeon to this case was necessary because of the complexity of the case as well as the 2 separate procedures of the buccal graft harvest as well as the urethroplasty.  The patient was taken to the operating room and general anesthesia was induced.  The patient was placed in the dorsal lithotomy position, prepped and draped in the usual sterile fashion, and preoperative antibiotics were administered. A preoperative time-out was performed.   Cystourethroscopy was performed using a 17 French rigid cystoscope noting a normal urethra to the bulbar region where there was a dense urethral stricture unable to be passed by the 17 [place] scope. We then passed a 6 [place] open-ended ureteral catheter through the scope and the stricture and then  on into the bladder lumen.  I then passed a 20 French red rubber catheter into the patient's urethra and advanced it to an area where it no longer was easily advanced and measured this as our distal margin. We then made a midline incision between the scrotum and the anus and took this down to the bulbar urethral muscle. A [place] Star retractor was then placed in the incision edges held open with hooks.  We then split the bulbourethral muscles midline and dissected down onto the urethra. We dissected the urethra clear anteriorly both proximally and distally to the area where the catheter no longer advanced. We then cleaned off the corpora bilaterally. We then dissected out the posterior aspect of the urethra and cut a vessel loop around it. We then mobilized the urethra both proximally and distally to incorporate the entire length stricture. Once the urethra was mobile we made an incision at the tip of the catheter on the dorsal aspect of the urethra. We then opened up the urethra dorsally through the stricture. We then passed a 20 French catheter through the proximal end of the opening to ensure that we could get this by. There was difficulty getting a 20 French catheter, but an 7518 French catheter was able to be passed. Performed cystoscopy again noting that the urethra just proximal to the membranous urethra was unhealthy appearing, but no clear stricture. The strictured segment that was open measured approximate 4 cm.  We then turned our attention to the mouth and performed a buccal mucosa harvest. This was performed by first placing a dental pack the pin to the patient's throat tucking the tongue away as well. I then put 2 stay sutures on  the mucosa just adjacent to the lips. These were used to retract the cheeks. I then used a ruler and measured 5 cm just inferior to Stensen's duct. We then measured approximately 15 mm wide. I then injected 1% lidocaine with epinephrine into the measured area and with a 15 blade  incised the graft. I then undermined the graft with Metzenbaum scissors ensuring that I was in the fatty layer above the masseter muscle. Once the graft was free I then reapproximated the edges using a 4-0 chromic suture in a running fashion. Hemostasis was noted to be excellent. We quickly Betadine the area and then packed the cheek so as to provide excellent hemostasis. Then we took the graft to the back table and defatted the graft extensively. We then placed a normal saline solution.   Next we returned back to the perineum and insured that the urethra was adequately dissected out and the stricture opened. Stay sutures were placed along the edges. The graft was then placed on the corporal bodies with 4-0 Vicryl in an interrupted fashion. Several quilting sutures were placed as well. I then started at the apex of the urethral incision and with a 4-0 Vicryl suture at the apex to the posterior side of the incision. I then placed a second suture adjacent to that and ran the posterior aspect or edge ensuring that urethral mucosa was sewn to the new buccal graft. Once I got approximately halfway down the posterior line I started on the inferior aspect and worked up tying off the suture in the middle. The anterior incision line was closed in a similar fashion with 4-0 Monocryl in a running fashion both superiorly and inferiorly meeting in the middle. Prior to completing the anterior aspect of the incision a catheter was placed, 18 [place] Silastic, without any significant difficulty.  I then closed the bulbar urethral muscle and overlying tissue with a 4-0 Vicryl in a running fashion. I then closed the Colles' fascia with a 3-0 Vicryl in a running fashion. I then closed the subcutaneous tissue in a interrupted fashion using 3-0 Vicryl. The skin was then reapproximated with a 4-0 subcuticular Monocryl stitch. Dermabond was then applied. Prior to skin closure corpus and Marcaine was injected into the incision.  The  packing was then subsequently removed from the patient's mouth and hemostasis was noted to be excellent. He was subsequently extubated and returned to PACU in stable condition.   Crist FatBenjamin W. Denisse Whitenack, M.D.

## 2016-02-26 NOTE — Anesthesia Postprocedure Evaluation (Signed)
Anesthesia Post Note  Patient: Caleb Sharp  Procedure(s) Performed: Procedure(s) (LRB): URETHROPLASTY WITH  AUTOLOGOUS BUCCAL MUCOSAL GRAFT (N/A)  Patient location during evaluation: PACU Anesthesia Type: General Level of consciousness: awake and alert Pain management: pain level controlled Vital Signs Assessment: post-procedure vital signs reviewed and stable Respiratory status: spontaneous breathing, nonlabored ventilation, respiratory function stable and patient connected to nasal cannula oxygen Cardiovascular status: blood pressure returned to baseline and stable Postop Assessment: no signs of nausea or vomiting Anesthetic complications: no       Last Vitals:  Vitals:   02/26/16 1305 02/26/16 1445  BP: (!) 96/46   Pulse: (!) 108   Resp: 15   Temp: 37.2 C 36.4 C    Last Pain:  Vitals:   02/26/16 1500  TempSrc:   PainSc: 2                  Raeya Merritts,W. EDMOND

## 2016-02-27 LAB — CBC
HCT: 37.4 % — ABNORMAL LOW (ref 39.0–52.0)
Hemoglobin: 12.6 g/dL — ABNORMAL LOW (ref 13.0–17.0)
MCH: 29.8 pg (ref 26.0–34.0)
MCHC: 33.7 g/dL (ref 30.0–36.0)
MCV: 88.4 fL (ref 78.0–100.0)
Platelets: 261 10*3/uL (ref 150–400)
RBC: 4.23 MIL/uL (ref 4.22–5.81)
RDW: 13.6 % (ref 11.5–15.5)
WBC: 13.3 10*3/uL — ABNORMAL HIGH (ref 4.0–10.5)

## 2016-02-27 LAB — BASIC METABOLIC PANEL
Anion gap: 7 (ref 5–15)
BUN: 11 mg/dL (ref 6–20)
CO2: 30 mmol/L (ref 22–32)
Calcium: 8.8 mg/dL — ABNORMAL LOW (ref 8.9–10.3)
Chloride: 100 mmol/L — ABNORMAL LOW (ref 101–111)
Creatinine, Ser: 0.84 mg/dL (ref 0.61–1.24)
GFR calc Af Amer: >60 mL/min (ref >60)
GFR calc non Af Amer: >60 mL/min (ref >60)
Glucose, Bld: 136 mg/dL — ABNORMAL HIGH (ref 65–99)
Potassium: 4 mmol/L (ref 3.5–5.1)
Sodium: 137 mmol/L (ref 135–145)

## 2016-02-27 LAB — GLUCOSE, CAPILLARY
Glucose-Capillary: 126 mg/dL — ABNORMAL HIGH (ref 65–99)
Glucose-Capillary: 128 mg/dL — ABNORMAL HIGH (ref 65–99)

## 2016-02-27 NOTE — Discharge Instructions (Signed)
I have reviewed discharge instructions in detail with the patient. They will follow-up with me or their physician as scheduled. My nurse will also be calling the patients as per protocol.   

## 2016-02-27 NOTE — Progress Notes (Signed)
Pt states he already knows how to do foley care and change out the drainage bag.  Reinforced how to clean foley.  Educated on dressing for incision.  Supplies given as instructed.  Will discharge as ordered.

## 2016-02-27 NOTE — Discharge Summary (Signed)
Patient looks very good Bit sore Incision good Detailed instructions

## 2016-02-29 LAB — URINE CULTURE: Culture: >=100000 — AB

## 2016-03-03 ENCOUNTER — Other Ambulatory Visit: Payer: Self-pay | Admitting: Urology

## 2016-03-04 ENCOUNTER — Other Ambulatory Visit (HOSPITAL_COMMUNITY): Payer: Self-pay | Admitting: Urology

## 2016-03-04 ENCOUNTER — Other Ambulatory Visit: Payer: Self-pay | Admitting: Urology

## 2016-03-04 DIAGNOSIS — N359 Urethral stricture, unspecified: Secondary | ICD-10-CM

## 2016-03-16 ENCOUNTER — Ambulatory Visit (HOSPITAL_COMMUNITY)
Admission: RE | Admit: 2016-03-16 | Discharge: 2016-03-16 | Disposition: A | Discharge status: Home / Self Care | Payer: BLUE CROSS/BLUE SHIELD | Source: Ambulatory Visit | Attending: Urology | Admitting: Urology

## 2016-03-16 DIAGNOSIS — Z48816 Encounter for surgical aftercare following surgery on the genitourinary system: Secondary | ICD-10-CM | POA: Insufficient documentation

## 2016-03-16 DIAGNOSIS — N359 Urethral stricture, unspecified: Secondary | ICD-10-CM | POA: Diagnosis present

## 2016-03-16 MED ORDER — IOPAMIDOL (ISOVUE-300) INJECTION 61%
INTRAVENOUS | Status: AC
  Filled 2016-03-16: qty 50

## 2016-03-16 MED ORDER — IOPAMIDOL (ISOVUE-300) INJECTION 61%: 50.0000 mL | Freq: Once | INTRAVENOUS | Status: DC | PRN

## 2016-09-15 ENCOUNTER — Emergency Department (HOSPITAL_BASED_OUTPATIENT_CLINIC_OR_DEPARTMENT_OTHER)
Admission: EM | Admit: 2016-09-15 | Discharge: 2016-09-15 | Disposition: A | Discharge status: Home / Self Care | Payer: BLUE CROSS/BLUE SHIELD | Attending: Emergency Medicine | Admitting: Emergency Medicine

## 2016-09-15 ENCOUNTER — Encounter (HOSPITAL_BASED_OUTPATIENT_CLINIC_OR_DEPARTMENT_OTHER): Payer: Self-pay | Admitting: *Deleted

## 2016-09-15 DIAGNOSIS — R202 Paresthesia of skin: Secondary | ICD-10-CM | POA: Insufficient documentation

## 2016-09-15 DIAGNOSIS — Z87891 Personal history of nicotine dependence: Secondary | ICD-10-CM | POA: Insufficient documentation

## 2016-09-15 DIAGNOSIS — R739 Hyperglycemia, unspecified: Secondary | ICD-10-CM

## 2016-09-15 DIAGNOSIS — J45909 Unspecified asthma, uncomplicated: Secondary | ICD-10-CM | POA: Insufficient documentation

## 2016-09-15 DIAGNOSIS — E1165 Type 2 diabetes mellitus with hyperglycemia: Secondary | ICD-10-CM | POA: Insufficient documentation

## 2016-09-15 DIAGNOSIS — Z7984 Long term (current) use of oral hypoglycemic drugs: Secondary | ICD-10-CM | POA: Insufficient documentation

## 2016-09-15 DIAGNOSIS — Z79899 Other long term (current) drug therapy: Secondary | ICD-10-CM | POA: Insufficient documentation

## 2016-09-15 DIAGNOSIS — I10 Essential (primary) hypertension: Secondary | ICD-10-CM | POA: Insufficient documentation

## 2016-09-15 LAB — CBC WITH DIFFERENTIAL/PLATELET
Basophils Absolute: 0 10*3/uL (ref 0.0–0.1)
Basophils Relative: 0 %
Eosinophils Absolute: 0.1 10*3/uL (ref 0.0–0.7)
Eosinophils Relative: 1 %
HCT: 46.7 % (ref 39.0–52.0)
Hemoglobin: 16.7 g/dL (ref 13.0–17.0)
Lymphocytes Relative: 24 %
Lymphs Abs: 1.8 10*3/uL (ref 0.7–4.0)
MCH: 30.3 pg (ref 26.0–34.0)
MCHC: 35.8 g/dL (ref 30.0–36.0)
MCV: 84.6 fL (ref 78.0–100.0)
Monocytes Absolute: 0.5 10*3/uL (ref 0.1–1.0)
Monocytes Relative: 7 %
Neutro Abs: 5.1 10*3/uL (ref 1.7–7.7)
Neutrophils Relative %: 68 %
Platelets: 255 10*3/uL (ref 150–400)
RBC: 5.52 MIL/uL (ref 4.22–5.81)
RDW: 12.3 % (ref 11.5–15.5)
WBC: 7.5 10*3/uL (ref 4.0–10.5)

## 2016-09-15 LAB — BASIC METABOLIC PANEL
Anion gap: 11 (ref 5–15)
BUN: 13 mg/dL (ref 6–20)
CO2: 26 mmol/L (ref 22–32)
Calcium: 9.8 mg/dL (ref 8.9–10.3)
Chloride: 96 mmol/L — ABNORMAL LOW (ref 101–111)
Creatinine, Ser: 1.1 mg/dL (ref 0.61–1.24)
GFR calc Af Amer: >60 mL/min (ref >60)
GFR calc non Af Amer: >60 mL/min (ref >60)
Glucose, Bld: 449 mg/dL — ABNORMAL HIGH (ref 65–99)
Potassium: 4.2 mmol/L (ref 3.5–5.1)
Sodium: 133 mmol/L — ABNORMAL LOW (ref 135–145)

## 2016-09-15 LAB — CBG MONITORING, ED
Glucose-Capillary: 435 mg/dL — ABNORMAL HIGH (ref 65–99)
Glucose-Capillary: 310 mg/dL — ABNORMAL HIGH (ref 65–99)

## 2016-09-15 MED ORDER — SODIUM CHLORIDE 0.9 % IV BOLUS (SEPSIS)
1000.0000 mL | Freq: Once | INTRAVENOUS | Status: AC
  Administered 2016-09-15: 1000 mL via INTRAVENOUS

## 2016-09-15 NOTE — ED Provider Notes (Signed)
[facility] Provider Note   CSN: [id number] Arrival date & time: 09/15/16  2031  By signing my name below, I, Rosana Fretana Waskiewicz, attest that this documentation has been prepared under the direction and in the presence of Pricilla LovelessGoldston, Lowen Barringer, MD. Electronically Signed: Rosana Fretana Waskiewicz, ED Scribe. 09/15/16. 9:32 PM.  History   Chief Complaint Chief Complaint  Patient presents with  . Hyperglycemia   The history is provided by the patient. No language interpreter was used.   HPI Comments: Caleb Sharp is a 31 y.o. male with a PMHx of DM, who presents to the Emergency Department for evaluation of hyperglycemia onset today. Pt states his blood sugar is normally in the 100s/200s and today his blood sugar was in the 500s. Pt reports associated dizziness (for 20 minutes) and arm numbness/tingling from the elbow to the hands onset 3 hours ago. Pt states he was at work when the numbness began. Pt takes metformin regularly and reports that he hasn't taken it in the past 2 days. Pt denies headache, neck pain, CP, fever, vomiting, diarrhea or any other complaints at this time. Numbness is improving, and almost gone. No leg numbness. No weakness.   Past Medical History:  Diagnosis Date  . ADHD   . Anginal pain (HCC)   . Asthma    sports induced asthma  . Bronchitis   . Foley catheter in place   . Gross hematuria   . Hypertension   . Type 2 diabetes mellitus (HCC)   . Urinary retention     Patient Active Problem List   Diagnosis Date Noted  . Urethral stricture 02/26/2016  . Complicated urinary tract infection 01/27/2016  . Diabetes mellitus type 2 in obese (HCC) 01/27/2016  . Septic shock (HCC) 01/26/2016  . 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 CYSTOSCOPY N/A 12/31/2015   Procedure: CYSTOSCOPY, URETEROSCOPY, SUPRAPUBIC TUBE PLACEMENT;  Surgeon: Bjorn PippinJohn Wrenn, MD;  Location: [facility];  Service: Urology;  Laterality: N/A;  .  CYSTOSCOPY WITH FULGERATION N/A 06/19/2015   Procedure: CYSTOSCOPY WITH FULGERATION;  Surgeon: Barron Alvineavid Grapey, MD;  [facility] [place];  Service: Urology;  Laterality: N/A;  . IR GENERIC HISTORICAL  01/30/2016   IR CATHETER TUBE CHANGE 01/30/2016 [facility]  . NO PAST SURGERIES    . URETHROPLASTY N/A 02/26/2016   Procedure: URETHROPLASTY WITH  AUTOLOGOUS BUCCAL MUCOSAL GRAFT;  Surgeon: Crist FatBenjamin W Herrick, MD;  Location: [facility];  Service: Urology;  Laterality: N/A;  DR. MACDIARMID TO ASSIST       Home Medications    Prior to Admission medications   Medication Sig Start Date End Date Taking? Authorizing Provider  metFORMIN (GLUCOPHAGE) 1000 MG tablet Take 1 tablet (1,000 mg total) by mouth 2 (two) times daily with a meal. 01/31/16  Yes Osvaldo ShipperKrishnan, Gokul, MD  ciprofloxacin (CIPRO) 500 MG tablet Take 1 tablet (500 mg total) by mouth 2 (two) times daily. 02/26/16   Crist FatHerrick, Benjamin W, MD  docusate sodium (COLACE) 100 MG capsule Take 1 capsule (100 mg total) by mouth 2 (two) times daily as needed (take to keep stool soft.). 02/26/16   Crist FatHerrick, Benjamin W, MD  lisinopril (PRINIVIL,ZESTRIL) 10 MG tablet Take 1 tablet (10 mg total) by mouth daily. 02/01/16   Osvaldo ShipperKrishnan, Gokul, MD  oxyCODONE (ROXICODONE) 5 MG immediate release tablet Take 1 tablet (5 mg total) by mouth every 4 (four) hours as needed. 02/26/16   Crist FatHerrick, Benjamin W, MD  Family History No family history on file.  Social History Social History  Substance Use Topics  . Smoking status: Former Smoker    Packs/day: 1.00    Years: 3.00    Types: Cigarettes    Quit date: 12/31/2014  . Smokeless tobacco: Never Used  . Alcohol use Yes     Comment: occasional     Allergies   Other   Review of Systems Review of Systems  Constitutional: Negative for fever.  Cardiovascular: Negative for chest pain.  Gastrointestinal: Negative for diarrhea and vomiting.  Musculoskeletal: Negative for neck pain.  Neurological:  Positive for dizziness and numbness. Negative for headaches.  All other systems reviewed and are negative.    Physical Exam Updated Vital Signs BP (!) 145/103   Pulse 98   Temp 98.5 F (36.9 C) (Oral)   Resp 20   Ht 5\' 7"  (1.702 m)   Wt 97.5 kg (215 lb)   SpO2 99%   BMI 33.67 kg/m   Physical Exam  Constitutional: He is oriented to person, place, and time. He appears well-developed and well-nourished.  HENT:  Head: Normocephalic and atraumatic.  Right Ear: External ear normal.  Left Ear: External ear normal.  Nose: Nose normal.  Eyes: Pupils are equal, round, and reactive to light. EOM are normal. Right eye exhibits no discharge. Left eye exhibits no discharge.  Neck: Neck supple.  Cardiovascular: Normal rate, regular rhythm and normal heart sounds.   No murmur heard. Pulses:      Radial pulses are 2+ on the right side, and 2+ on the left side.  Pulmonary/Chest: Effort normal and breath sounds normal. No respiratory distress.  Abdominal: Soft. There is no tenderness.  Musculoskeletal: He exhibits no edema.  Neurological: He is alert and oriented to person, place, and time. No cranial nerve deficit.  CN 3-12 grossly intact. 5/5 strength in all 4 extremities. Grossly normal sensation. Normal finger to nose.   Skin: Skin is warm and dry.  Nursing note and vitals reviewed.    ED Treatments / Results  DIAGNOSTIC STUDIES: Oxygen Saturation is 100% on RA, normal by my interpretation.   COORDINATION OF CARE: 9:31 PM-Discussed next steps with pt including taking his Metformin regularly. Pt verbalized understanding and is agreeable with the plan.   Labs (all labs ordered are listed, but only abnormal results are displayed) Labs Reviewed  BASIC METABOLIC PANEL - Abnormal; Notable for the following:       Result Value   Sodium 133 (*)    Chloride 96 (*)    Glucose, Bld 449 (*)    All other components within normal limits  CBG MONITORING, ED - Abnormal; Notable for the  following:    Glucose-Capillary 435 (*)    All other components within normal limits  CBG MONITORING, ED - Abnormal; Notable for the following:    Glucose-Capillary 310 (*)    All other components within normal limits  CBC WITH DIFFERENTIAL/PLATELET  CBG MONITORING, ED    EKG  EKG Interpretation  Date/Time:  Tuesday September 15 2016 21:48:18 EDT Ventricular Rate:  101 PR Interval:    QRS Duration: 95 QT Interval:  347 QTC Calculation: 450 R Axis:   20 Text Interpretation:  Sinus tachycardia Probable left atrial enlargement Baseline wander in lead(s) I III aVL rate is slower, otherwise similar to 2017 Confirmed by Pricilla Loveless [phone]) on 09/15/2016 10:01:01 PM       Radiology No results found.  Procedures Procedures (including critical care time)  Medications Ordered in ED Medications  sodium chloride 0.9 % bolus 1,000 mL (0 mLs Intravenous Stopped 09/15/16 2147)  sodium chloride 0.9 % bolus 1,000 mL (0 mLs Intravenous Stopped 09/15/16 2306)     Initial Impression / Assessment and Plan / ED Course  I have reviewed the triage vital signs and the nursing notes.  Pertinent labs & imaging results that were available during my care of the patient were reviewed by me and considered in my medical decision making (see chart for details).     Patient's transient paresthesias are of unclear etiology, probably related to dehydration and hyperglycemia. There is no evidence of acidosis with a normal anion gap and normal bicarbonate. After fluids, he has no further paresthesias. He never had any decreased sensation or focal neurologic deficits on my exam. He later tells me that the reason he stopped taking metformin is because it causes diarrhea. I have discussed that he needs to follow-up with his PCP for change in these medicines and otherwise to at least restart the metformin for now. As for his transient dizziness is probably also related to some dehydration given the hyperglycemia. Low  suspicion for a cardiac arrhythmia. No anginal symptoms. Discharge home with return precautions.  Final Clinical Impressions(s) / ED Diagnoses   Final diagnoses:  Hyperglycemia  Paresthesia of both hands    New Prescriptions New Prescriptions   No medications on file   I personally performed the services described in this documentation, which was scribed in my presence. The recorded information has been reviewed and is accurate.     Pricilla Loveless, MD 09/15/16 [phone]

## 2016-09-15 NOTE — ED Triage Notes (Signed)
He has been too busy to take his Metformin the past 2 days. Numbness in his arms.

## 2017-01-20 IMAGING — RF DG RETROGRADE-URETHROGRAM
8 series · 11 of 11 positions shown · IV contrast (isovue)
Comparison: Retrograde urethrogram 01/31/2016

CLINICAL DATA: Transperineal surgical repair of urethra for
stricture 02/26/2016. Foley catheter in place. Evaluate for leak.

EXAM:
RETROGRADE URETHROGRAM
TECHNIQUE: Foley catheter in place.  Suprapubic catheter also in place.
Eight French feeding tube was placed alongside the Foley catheter in
the distal penile urethra. Isovue contrast injected filling the
penile urethra.

[Series 1: run · 1 of 1 slices shown (1 of 8)]
[im 1/1]
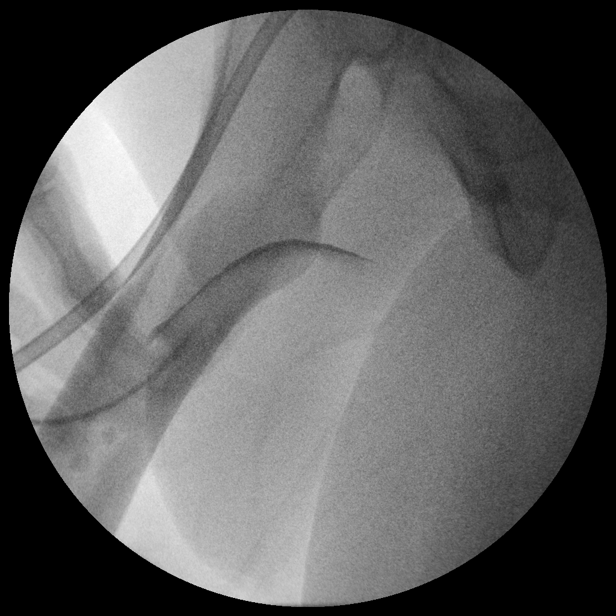

[Series 2: run · 1 of 1 slices shown (2 of 8)]
[im 1/1]
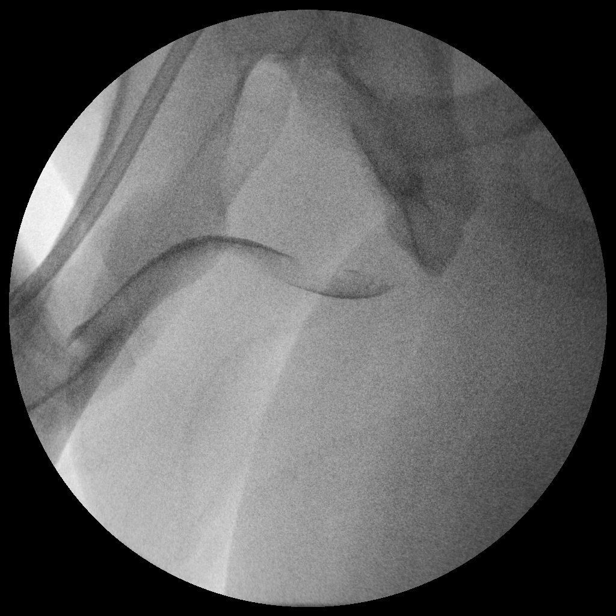

[Series 3: run · 1 of 1 slices shown (3 of 8)]
[im 1/1]
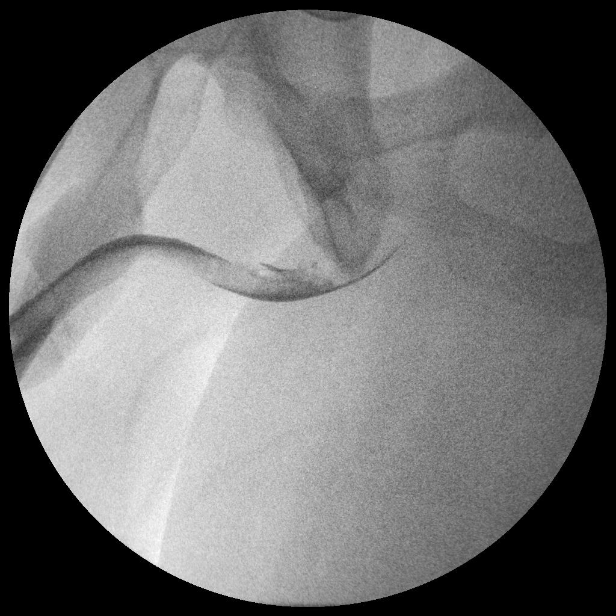

[Series 4: run · 1 of 1 slices shown (4 of 8)]
[im 1/1]
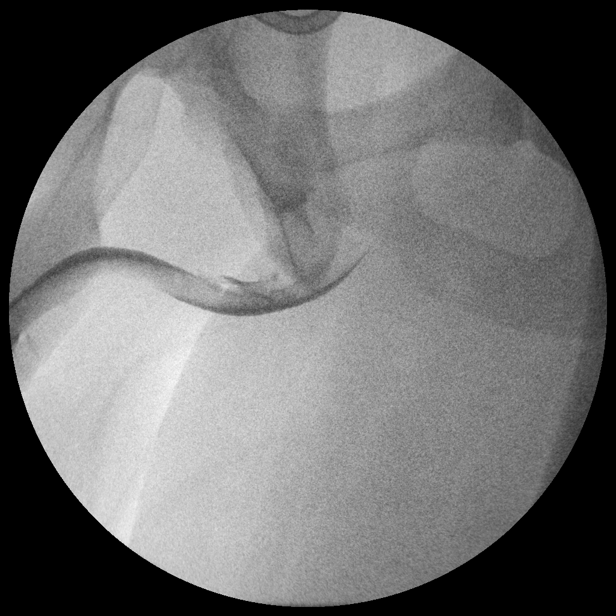

[Series 5: run · 1 of 1 slices shown (5 of 8)]
[im 1/1]
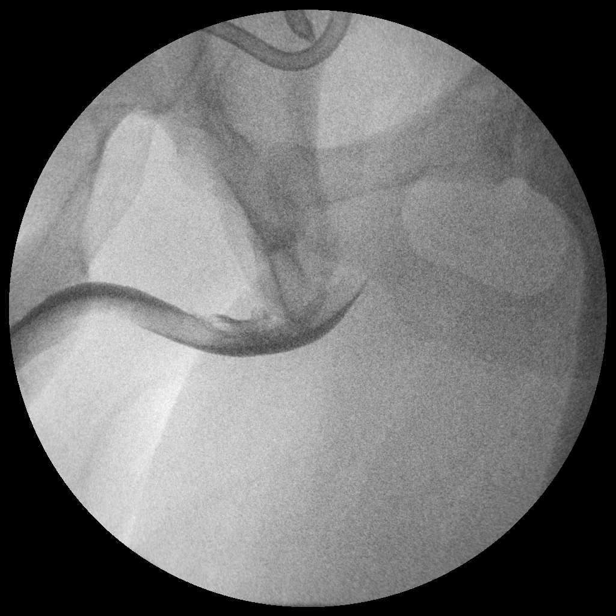

[Series 6: run · 1 of 1 slices shown (6 of 8)]
[im 1/1]
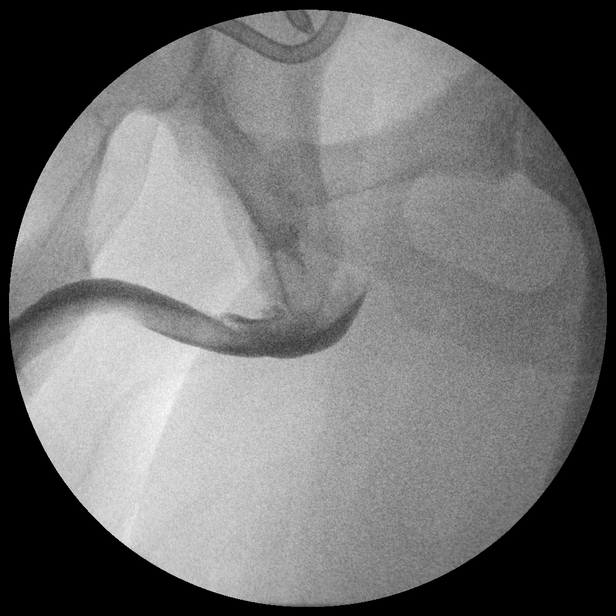

[Series 7: run · 1 of 1 slices shown (7 of 8)]
[im 1/1]
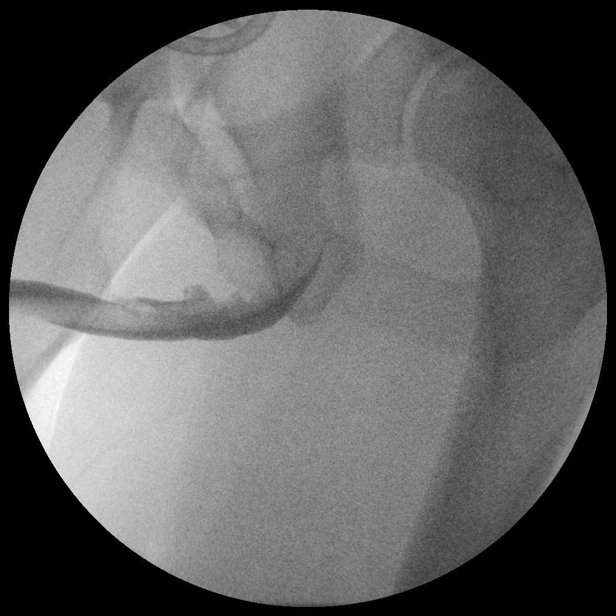

[Series 8: run · 4 of 4 slices shown (8 of 8)]
[im 1/4]
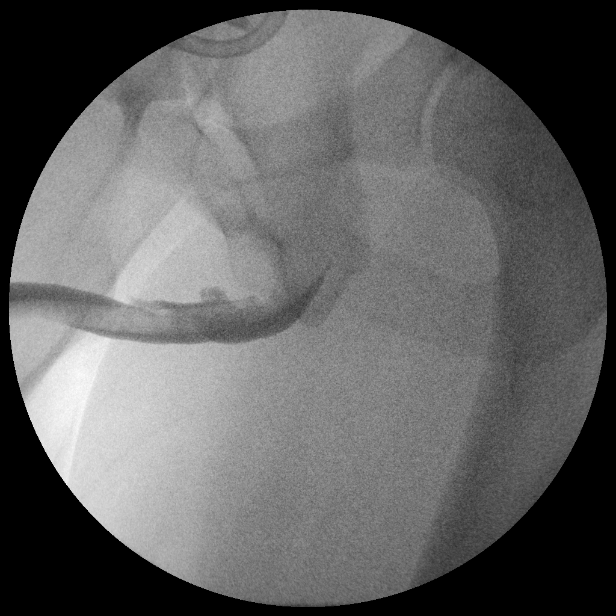
[im 2/4]
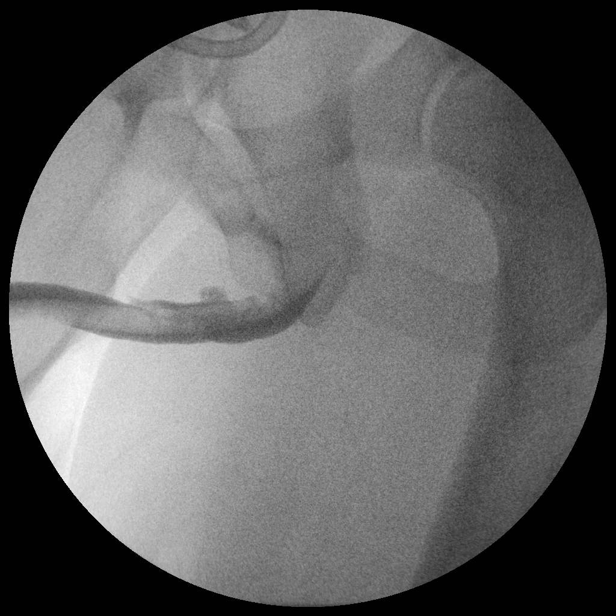
[im 3/4]
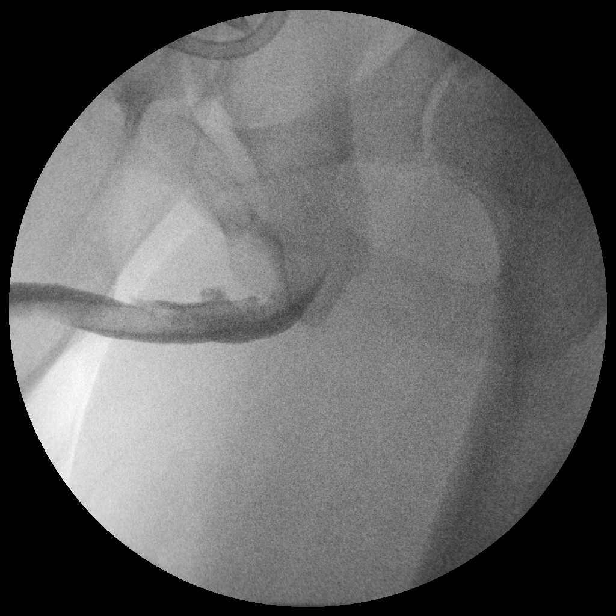
[im 4/4]
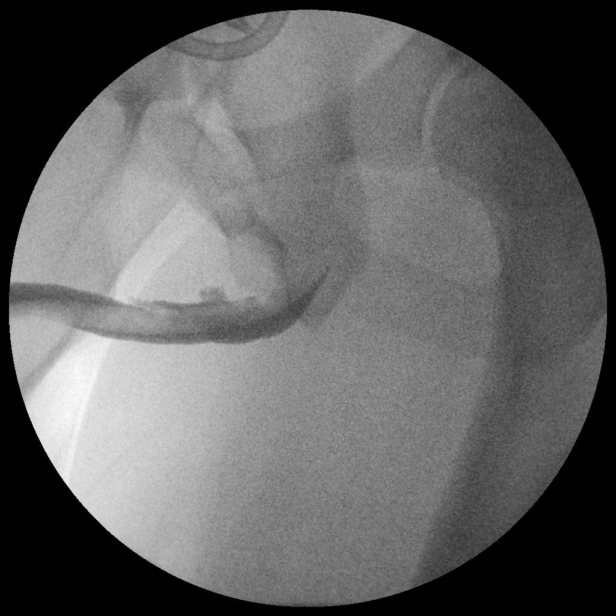

[11 of 11 positions shown; findings below may reference images not displayed]

FINDINGS: Penile urethra filled with contrast to the level the membranous
urethra. No filling of the prostatic urethra due to the Foley
catheter.

Mild irregularity of the mucosa of the bulbous urethra at the site
of the prior stricture. This is most compatible with surgical
repair. No gross leak or fistula identified.
IMPRESSION: Irregularity of the bulbous urethra at the site of prior stricture
repair. No evidence of leak.

## 2018-08-31 ENCOUNTER — Encounter (HOSPITAL_BASED_OUTPATIENT_CLINIC_OR_DEPARTMENT_OTHER): Payer: Self-pay | Admitting: Emergency Medicine

## 2018-08-31 ENCOUNTER — Emergency Department (HOSPITAL_BASED_OUTPATIENT_CLINIC_OR_DEPARTMENT_OTHER)
Admission: EM | Admit: 2018-08-31 | Discharge: 2018-08-31 | Disposition: A | Discharge status: Home / Self Care | Payer: Self-pay | Attending: Emergency Medicine | Admitting: Emergency Medicine

## 2018-08-31 ENCOUNTER — Emergency Department (HOSPITAL_BASED_OUTPATIENT_CLINIC_OR_DEPARTMENT_OTHER)
Admission: EM | Admit: 2018-08-31 | Discharge: 2018-08-31 | Disposition: A | Discharge status: Home / Self Care | Payer: HRSA Program | Attending: Emergency Medicine | Admitting: Emergency Medicine

## 2018-08-31 ENCOUNTER — Other Ambulatory Visit: Payer: Self-pay

## 2018-08-31 DIAGNOSIS — B349 Viral infection, unspecified: Secondary | ICD-10-CM | POA: Diagnosis not present

## 2018-08-31 DIAGNOSIS — Z87891 Personal history of nicotine dependence: Secondary | ICD-10-CM | POA: Insufficient documentation

## 2018-08-31 DIAGNOSIS — J45909 Unspecified asthma, uncomplicated: Secondary | ICD-10-CM | POA: Insufficient documentation

## 2018-08-31 DIAGNOSIS — E119 Type 2 diabetes mellitus without complications: Secondary | ICD-10-CM | POA: Insufficient documentation

## 2018-08-31 DIAGNOSIS — I10 Essential (primary) hypertension: Secondary | ICD-10-CM | POA: Insufficient documentation

## 2018-08-31 DIAGNOSIS — R05 Cough: Secondary | ICD-10-CM | POA: Diagnosis present

## 2018-08-31 DIAGNOSIS — Z20828 Contact with and (suspected) exposure to other viral communicable diseases: Secondary | ICD-10-CM | POA: Diagnosis not present

## 2018-08-31 DIAGNOSIS — Z0279 Encounter for issue of other medical certificate: Secondary | ICD-10-CM | POA: Insufficient documentation

## 2018-08-31 NOTE — ED Provider Notes (Signed)
[facility] Emergency Department Provider Note MRN:  [id number]  Arrival date & time: 08/31/18     Chief Complaint   cough and sore throat   History of Present Illness   Caleb Sharp is a 33 y.o. year-old male with a history of hypertension, diabetes presenting to the ED with chief complaint of cough, sore throat.  Symptoms began yesterday, gradual onset, have been mild.  Patient explains that he thinks he just has a virus, but his boss was concerned given the setting of global pandemic and sent him here for evaluation.  Cough is mild, mild sore throat, mild dull frontal headache, denies chest pain or shortness of breath, no abdominal pain, no numbness or weakness to the arms or legs.  No exacerbating or alleviating factors.  Review of Systems  A complete 10 system review of systems was obtained and all systems are negative except as noted in the HPI and PMH.   Patient's Health History    Past Medical History:  Diagnosis Date  . ADHD   . Anginal pain ([place])   . Asthma    sports induced asthma  . Bronchitis   . Foley catheter in place   . Gross hematuria   . Hypertension   . Type 2 diabetes mellitus ([place])   . Urinary retention     Past Surgical History:  Procedure Laterality Date  . CYSTOSCOPY N/A 12/31/2015   Procedure: CYSTOSCOPY, URETEROSCOPY, SUPRAPUBIC TUBE PLACEMENT;  Surgeon: Irine Seal, MD;  Location: [facility];  Service: Urology;  Laterality: N/A;  . CYSTOSCOPY WITH FULGERATION N/A 06/19/2015   Procedure: CYSTOSCOPY WITH FULGERATION;  Surgeon: Rana Snare, MD;  Location: [facility];  Service: Urology;  Laterality: N/A;  . IR GENERIC HISTORICAL  01/30/2016   IR CATHETER TUBE CHANGE 01/30/2016 [facility]  . NO PAST SURGERIES    . URETHROPLASTY N/A 02/26/2016   Procedure: URETHROPLASTY WITH  AUTOLOGOUS BUCCAL MUCOSAL GRAFT;  Surgeon: Ardis Hughs, MD;  Location: [facility];  Service: Urology;  Laterality: N/A;  DR.  MACDIARMID TO ASSIST    No family history on file.  Social History   Socioeconomic History  . Marital status: Single    Spouse name: Not on file  . Number of children: Not on file  . Years of education: Not on file  . Highest education level: Not on file  Occupational History  . Not on file  Social Needs  . Financial resource strain: Not on file  . Food insecurity    Worry: Not on file    Inability: Not on file  . Transportation needs    Medical: Not on file    Non-medical: Not on file  Tobacco Use  . Smoking status: Former Smoker    Packs/day: 1.00    Years: 3.00    Pack years: 3.00    Types: Cigarettes    Quit date: 12/31/2014    Years since quitting: 3.6  . Smokeless tobacco: Never Used  Substance and Sexual Activity  . Alcohol use: Yes    Comment: occasional  . Drug use: No  . Sexual activity: Yes    Birth control/protection: None  Lifestyle  . Physical activity    Days per week: Not on file    Minutes per session: Not on file  . Stress: Not on file  Relationships  . Social Herbalist on phone: Not on file    Gets together: Not on file  Attends religious service: Not on file    Active member of club or organization: Not on file    Attends meetings of clubs or organizations: Not on file    Relationship status: Not on file  . Intimate partner violence    Fear of current or ex partner: Not on file    Emotionally abused: Not on file    Physically abused: Not on file    Forced sexual activity: Not on file  Other Topics Concern  . Not on file  Social History Narrative  . Not on file     Physical Exam  Vital Signs and Nursing Notes reviewed Vitals:   08/31/18 0741 08/31/18 0752  BP: (!) 151/107 (!) 148/108  Pulse: 99   Resp: 16   Temp: 98.6 F (37 C)   SpO2: 98%     CONSTITUTIONAL: Well-appearing, NAD NEURO:  Alert and oriented x 3, no focal deficits, no meningismus EYES:  eyes equal and reactive ENT/NECK:  no LAD, no JVD CARDIO:  Regular rate, well-perfused, normal S1 and S2 PULM:  CTAB no wheezing or rhonchi GI/GU:  normal bowel sounds, non-distended, non-tender MSK/SPINE:  No gross deformities, no edema SKIN:  no rash, atraumatic PSYCH:  Appropriate speech and behavior  Diagnostic and Interventional Summary    Labs Reviewed - No data to display  No orders to display    Medications - No data to display   Procedures Critical Care  ED Course and Medical Decision Making  I have reviewed the triage vital signs and the nursing notes.  Pertinent labs & imaging results that were available during my care of the patient were reviewed by me and considered in my medical decision making (see below for details).  Normal vital signs, lungs clear, no meningismus, suspect viral illness, nothing on exam to suggest strep throat.  Options discussed with patient, who is not interested in coronavirus testing and would prefer home isolation.  After the discussed management above, the patient was determined to be safe for discharge.  The patient was in agreement with this plan and all questions regarding their care were answered.  ED return precautions were discussed and the patient will return to the ED with any significant worsening of condition.  Caleb Sharp was evaluated in Emergency Department on 08/31/2018 for the symptoms described in the history of present illness. He was evaluated in the context of the global COVID-19 pandemic, which necessitated consideration that the patient might be at risk for infection with the SARS-CoV-2 virus that causes COVID-19. Institutional protocols and algorithms that pertain to the evaluation of patients at risk for COVID-19 are in a state of rapid change based on information released by regulatory bodies including the CDC and federal and state organizations. These policies and algorithms were followed during the patient's care in the ED.   Elmer SowMichael M. Pilar PlateBero, MD [facility]  [facility] [email or web address]  Final Clinical Impressions(s) / ED Diagnoses     ICD-10-CM   1. Viral illness  B34.9     ED Discharge Orders    None         Sabas SousBero, Josel Keo M, MD 08/31/18 [phone]

## 2018-08-31 NOTE — ED Triage Notes (Signed)
Pt seen in ED earlier this morning after being sent by his work place to be sure he was "ok" since he has had a dry cough and sore throat since yesterday.  He Refused covid test at that time.  Stated he would rather be quarrantined and then go back to work. Pt presents now because he "can't be out of work that long."  Wants a test so he can go back to work today. Pt informed prior to checkin that he could not get the results today and he accepted that fact.

## 2018-08-31 NOTE — ED Triage Notes (Signed)
Dry cough, HA and sore throat since yesterday.  Sts he thinks he has a cold but he was sent here from work to be checked.

## 2018-08-31 NOTE — ED Notes (Signed)
ED Provider at bedside. 

## 2018-08-31 NOTE — ED Provider Notes (Signed)
[facility] Emergency Department Provider Note MRN:  [id number]  Arrival date & time: 08/31/18     Chief Complaint   Covid test   History of Present Illness   Caleb Sharp is a 33 y.o. year-old male with a history of diabetes presenting to the ED with chief complaint of cough.  Patient has been experiencing cough, malaise, sore throat since last night.  Sent here by boss for evaluation given the global pandemic.  Patient was discharged and instructed to isolate at home, but patient's boss prefers that he be tested so that he can come back to work.  Review of Systems  A complete 10 system review of systems was obtained and all systems are negative except as noted in the HPI and PMH.   Patient's Health History    Past Medical History:  Diagnosis Date  . ADHD   . Anginal pain ([place])   . Asthma    sports induced asthma  . Bronchitis   . Foley catheter in place   . Gross hematuria   . Hypertension   . Type 2 diabetes mellitus ([place])   . Urinary retention     Past Surgical History:  Procedure Laterality Date  . CYSTOSCOPY N/A 12/31/2015   Procedure: CYSTOSCOPY, URETEROSCOPY, SUPRAPUBIC TUBE PLACEMENT;  Surgeon: Irine Seal, MD;  [facility];  Service: Urology;  Laterality: N/A;  . CYSTOSCOPY WITH FULGERATION N/A 06/19/2015   Procedure: CYSTOSCOPY WITH FULGERATION;  Surgeon: Rana Snare, MD;  Location: [facility];  Service: Urology;  Laterality: N/A;  . IR GENERIC HISTORICAL  01/30/2016   IR CATHETER TUBE CHANGE 01/30/2016 [facility]  . NO PAST SURGERIES    . URETHROPLASTY N/A 02/26/2016   Procedure: URETHROPLASTY WITH  AUTOLOGOUS BUCCAL MUCOSAL GRAFT;  Surgeon: Ardis Hughs, MD;  [facility];  Service: Urology;  Laterality: N/A;  DR. MACDIARMID TO ASSIST    No family history on file.  Social History   Socioeconomic History  . Marital status: Single    Spouse name: Not on file  . Number of children: Not on file   . Years of education: Not on file  . Highest education level: Not on file  Occupational History  . Not on file  Social Needs  . Financial resource strain: Not on file  . Food insecurity    Worry: Not on file    Inability: Not on file  . Transportation needs    Medical: Not on file    Non-medical: Not on file  Tobacco Use  . Smoking status: Former Smoker    Packs/day: 1.00    Years: 3.00    Pack years: 3.00    Types: Cigarettes    Quit date: 12/31/2014    Years since quitting: 3.6  . Smokeless tobacco: Never Used  Substance and Sexual Activity  . Alcohol use: Yes    Comment: occasional  . Drug use: No  . Sexual activity: Yes    Birth control/protection: None  Lifestyle  . Physical activity    Days per week: Not on file    Minutes per session: Not on file  . Stress: Not on file  Relationships  . Social Herbalist on phone: Not on file    Gets together: Not on file    Attends religious service: Not on file    Active member of club or organization: Not on file    Attends meetings of clubs or organizations: Not  on file    Relationship status: Not on file  . Intimate partner violence    Fear of current or ex partner: Not on file    Emotionally abused: Not on file    Physically abused: Not on file    Forced sexual activity: Not on file  Other Topics Concern  . Not on file  Social History Narrative  . Not on file     Physical Exam  Vital Signs and Nursing Notes reviewed Vitals:   08/31/18 1046  BP: (!) 135/93  Pulse: (!) 103  Resp: 16  Temp: 98.8 F (37.1 C)  SpO2: 98%    CONSTITUTIONAL: Well-appearing, NAD NEURO:  Alert and oriented x 3, no focal deficits EYES:  eyes equal and reactive ENT/NECK:  no LAD, no JVD CARDIO: Regular rate, well-perfused, normal S1 and S2 PULM:  CTAB no wheezing or rhonchi GI/GU:  normal bowel sounds, non-distended, non-tender MSK/SPINE:  No gross deformities, no edema SKIN:  no rash, atraumatic PSYCH:  Appropriate  speech and behavior  Diagnostic and Interventional Summary    Labs Reviewed  NOVEL CORONAVIRUS, NAA (HOSPITAL ORDER, SEND-OUT TO REF LAB)    No orders to display    Medications - No data to display   Procedures Critical Care  ED Course and Medical Decision Making  I have reviewed the triage vital signs and the nursing notes.  Pertinent labs & imaging results that were available during my care of the patient were reviewed by me and considered in my medical decision making (see below for details).  See my same day ED note for further details.  Patient wishing to return back to work.  The [facility] has had questionable sensitivity.  Will swab with send out test.  Patient advised to isolate at home until this test results within the next 1 to 2 days.  After the discussed management above, the patient was determined to be safe for discharge.  The patient was in agreement with this plan and all questions regarding their care were answered.  ED return precautions were discussed and the patient will return to the ED with any significant worsening of condition.  Caleb Sharp was evaluated in Emergency Department on 08/31/2018 for the symptoms described in the history of present illness. He was evaluated in the context of the global COVID-19 pandemic, which necessitated consideration that the patient might be at risk for infection with the SARS-CoV-2 virus that causes COVID-19. Institutional protocols and algorithms that pertain to the evaluation of patients at risk for COVID-19 are in a state of rapid change based on information released by regulatory bodies including the CDC and federal and state organizations. These policies and algorithms were followed during the patient's care in the ED.   Elmer Sow M. Pilar PlateBero, MD [facility] [email or web address]  Final Clinical Impressions(s) / ED Diagnoses     ICD-10-CM   1. Viral  illness  B34.9     ED Discharge Orders    None         Sabas SousBero,  M, MD 08/31/18 1056

## 2018-08-31 NOTE — Discharge Instructions (Addendum)
You were evaluated in the Emergency Department and after careful evaluation, we did not find any emergent condition requiring admission or further testing in the hospital.  Your symptoms today seem to be due to a viral illness.  We have swabbed you for the coronavirus, and your results should come in within the next 2 to 4 days.  If negative, you should be able to return to work.  If positive, you should continue home isolation for at least 1 week and you should not return to work until you are without fever for at least 3 days.  Please return to the Emergency Department if you experience any worsening of your condition.  We encourage you to follow up with a primary care provider.  Thank you for allowing [place] to be a part of your care.

## 2018-08-31 NOTE — Discharge Instructions (Signed)
You were evaluated in the Emergency Department and after careful evaluation, we did not find any emergent condition requiring admission or further testing in the hospital.  Your symptoms today seem to be due to a viral illness.  The novel coronavirus, COVID-19, is unlikely but possible.  In this scenario, the CDC recommends home isolation for the next 7 to 10 days.  You should not return to work until that time, and you should not return to work until you are without fever for at least 3 days.  Drink plenty of fluids at home and use Tylenol or ibuprofen for discomfort.  Please return to the Emergency Department if you experience any worsening of your condition.  We encourage you to follow up with a primary care provider.  Thank you for allowing [place] to be a part of your care.

## 2018-09-01 LAB — NOVEL CORONAVIRUS, NAA (HOSP ORDER, SEND-OUT TO REF LAB; TAT 18-24 HRS): SARS-CoV-2, NAA: NOT DETECTED

## 2019-07-14 ENCOUNTER — Emergency Department (HOSPITAL_BASED_OUTPATIENT_CLINIC_OR_DEPARTMENT_OTHER): Payer: Managed Care, Other (non HMO)

## 2019-07-14 ENCOUNTER — Emergency Department (HOSPITAL_BASED_OUTPATIENT_CLINIC_OR_DEPARTMENT_OTHER)
Admission: EM | Admit: 2019-07-14 | Discharge: 2019-07-14 | Disposition: A | Discharge status: Home / Self Care | Payer: Managed Care, Other (non HMO) | Attending: Emergency Medicine | Admitting: Emergency Medicine

## 2019-07-14 ENCOUNTER — Other Ambulatory Visit: Payer: Self-pay

## 2019-07-14 ENCOUNTER — Encounter (HOSPITAL_BASED_OUTPATIENT_CLINIC_OR_DEPARTMENT_OTHER): Payer: Self-pay | Admitting: *Deleted

## 2019-07-14 DIAGNOSIS — U071 COVID-19: Secondary | ICD-10-CM | POA: Insufficient documentation

## 2019-07-14 DIAGNOSIS — J45909 Unspecified asthma, uncomplicated: Secondary | ICD-10-CM | POA: Insufficient documentation

## 2019-07-14 DIAGNOSIS — R5383 Other fatigue: Secondary | ICD-10-CM | POA: Diagnosis not present

## 2019-07-14 DIAGNOSIS — R509 Fever, unspecified: Secondary | ICD-10-CM | POA: Insufficient documentation

## 2019-07-14 DIAGNOSIS — M7918 Myalgia, other site: Secondary | ICD-10-CM | POA: Diagnosis not present

## 2019-07-14 DIAGNOSIS — I10 Essential (primary) hypertension: Secondary | ICD-10-CM | POA: Insufficient documentation

## 2019-07-14 DIAGNOSIS — E119 Type 2 diabetes mellitus without complications: Secondary | ICD-10-CM | POA: Diagnosis not present

## 2019-07-14 DIAGNOSIS — Z87891 Personal history of nicotine dependence: Secondary | ICD-10-CM | POA: Insufficient documentation

## 2019-07-14 DIAGNOSIS — R05 Cough: Secondary | ICD-10-CM | POA: Diagnosis present

## 2019-07-14 LAB — CBC WITH DIFFERENTIAL/PLATELET
Abs Immature Granulocytes: 0.02 10*3/uL (ref 0.00–0.07)
Basophils Absolute: 0 10*3/uL (ref 0.0–0.1)
Basophils Relative: 0 %
Eosinophils Absolute: 0 10*3/uL (ref 0.0–0.5)
Eosinophils Relative: 0 %
HCT: 53.5 % — ABNORMAL HIGH (ref 39.0–52.0)
Hemoglobin: 18.6 g/dL — ABNORMAL HIGH (ref 13.0–17.0)
Immature Granulocytes: 0 %
Lymphocytes Relative: 20 %
Lymphs Abs: 1 10*3/uL (ref 0.7–4.0)
MCH: 30.5 pg (ref 26.0–34.0)
MCHC: 34.8 g/dL (ref 30.0–36.0)
MCV: 87.7 fL (ref 80.0–100.0)
Monocytes Absolute: 0.7 10*3/uL (ref 0.1–1.0)
Monocytes Relative: 14 %
Neutro Abs: 3 10*3/uL (ref 1.7–7.7)
Neutrophils Relative %: 66 %
Platelets: 234 10*3/uL (ref 150–400)
RBC: 6.1 MIL/uL — ABNORMAL HIGH (ref 4.22–5.81)
RDW: 12 % (ref 11.5–15.5)
WBC: 4.7 10*3/uL (ref 4.0–10.5)
nRBC: 0 % (ref 0.0–0.2)

## 2019-07-14 LAB — COMPREHENSIVE METABOLIC PANEL WITH GFR
ALT: 67 U/L — ABNORMAL HIGH (ref 0–44)
AST: 41 U/L (ref 15–41)
Albumin: 4 g/dL (ref 3.5–5.0)
Alkaline Phosphatase: 71 U/L (ref 38–126)
Anion gap: 11 (ref 5–15)
BUN: 10 mg/dL (ref 6–20)
CO2: 24 mmol/L (ref 22–32)
Calcium: 9.2 mg/dL (ref 8.9–10.3)
Chloride: 99 mmol/L (ref 98–111)
Creatinine, Ser: 0.93 mg/dL (ref 0.61–1.24)
GFR calc Af Amer: >60 mL/min
GFR calc non Af Amer: >60 mL/min
Glucose, Bld: 312 mg/dL — ABNORMAL HIGH (ref 70–99)
Potassium: 4 mmol/L (ref 3.5–5.1)
Sodium: 134 mmol/L — ABNORMAL LOW (ref 135–145)
Total Bilirubin: 0.6 mg/dL (ref 0.3–1.2)
Total Protein: 7.9 g/dL (ref 6.5–8.1)

## 2019-07-14 LAB — URINALYSIS, ROUTINE W REFLEX MICROSCOPIC
Bilirubin Urine: NEGATIVE
Glucose, UA: >=500 mg/dL — AB
Hgb urine dipstick: NEGATIVE
Ketones, ur: NEGATIVE mg/dL
Leukocytes,Ua: NEGATIVE
Nitrite: NEGATIVE
Protein, ur: NEGATIVE mg/dL
Specific Gravity, Urine: 1.015 (ref 1.005–1.030)
pH: 6 (ref 5.0–8.0)

## 2019-07-14 LAB — URINALYSIS, MICROSCOPIC (REFLEX)

## 2019-07-14 LAB — SARS CORONAVIRUS 2 AG (30 MIN TAT): SARS Coronavirus 2 Ag: POSITIVE — AB

## 2019-07-14 MED ORDER — ALBUTEROL SULFATE HFA 108 (90 BASE) MCG/ACT IN AERS
4.0000 | INHALATION_SPRAY | Freq: Once | RESPIRATORY_TRACT | Status: AC
  Administered 2019-07-14: 4 via RESPIRATORY_TRACT
  Filled 2019-07-14: qty 6.7

## 2019-07-14 MED ORDER — ACETAMINOPHEN 500 MG PO TABS
1000.0000 mg | ORAL_TABLET | Freq: Once | ORAL | Status: AC
  Administered 2019-07-14: 1000 mg via ORAL
  Filled 2019-07-14: qty 2

## 2019-07-14 MED ORDER — HYDROCODONE-HOMATROPINE 5-1.5 MG/5ML PO SYRP: 5.0000 mL | ORAL_SOLUTION | Freq: Four times a day (QID) | ORAL | 0 refills | Status: DC | PRN

## 2019-07-14 MED ORDER — GUAIFENESIN-DM 100-10 MG/5ML PO SYRP
5.0000 mL | ORAL_SOLUTION | ORAL | Status: DC | PRN
  Filled 2019-07-14: qty 5

## 2019-07-14 NOTE — ED Provider Notes (Signed)
[facility] EMERGENCY DEPARTMENT Provider Note   CSN: [id number] Arrival date & time: 07/14/19  1730     History Chief Complaint  Patient presents with  . Covid Symptoms    Caleb Sharp is a 34 y.o. male.  HPI Patient developed cough fever body aches and chills 2 days ago.  He reports he aches all over and feels very fatigued.  He does not have chest pain.  Reports he has been coughing.  No vomiting or diarrhea.  Patient reports he has been going to work he is not sure of any sick exposures.  Patient reports he is a diabetic.  He takes Metformin with which she is compliant.  He reports his blood sugars have been controlled.  No frequent urination.  No blurred vision.    Past Medical History:  Diagnosis Date  . ADHD   . Anginal pain (HCC)   . Asthma    sports induced asthma  . Bronchitis   . Foley catheter in place   . Gross hematuria   . Hypertension   . Type 2 diabetes mellitus (HCC)   . Urinary retention     Patient Active Problem List   Diagnosis Date Noted  . Urethral stricture 02/26/2016  . Complicated urinary tract infection 01/27/2016  . Diabetes mellitus type 2 in obese (HCC) 01/27/2016  . Septic shock (HCC) 01/26/2016  . 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 CYSTOSCOPY N/A 12/31/2015   Procedure: CYSTOSCOPY, URETEROSCOPY, SUPRAPUBIC TUBE PLACEMENT;  Surgeon: Bjorn Pippin, MD;  Location: [facility];  Service: Urology;  Laterality: N/A;  . CYSTOSCOPY WITH FULGERATION N/A 06/19/2015   Procedure: CYSTOSCOPY WITH FULGERATION;  Surgeon: Barron Alvine, MD;  Location[facility];  Service: Urology;  Laterality: N/A;  . IR GENERIC HISTORICAL  01/30/2016   IR CATHETER TUBE CHANGE 01/30/2016 [facility]  . NO PAST SURGERIES    . URETHROPLASTY N/A 02/26/2016   Procedure: URETHROPLASTY WITH  AUTOLOGOUS BUCCAL MUCOSAL GRAFT;  Surgeon: Crist Fat, MD;  Location: [facility];  Service:  Urology;  Laterality: N/A;  DR. MACDIARMID TO ASSIST       No family history on file.  Social History   Tobacco Use  . Smoking status: Former Smoker    Packs/day: 1.00    Years: 3.00    Pack years: 3.00    Types: Cigarettes    Quit date: 12/31/2014    Years since quitting: 4.5  . Smokeless tobacco: Never Used  Substance Use Topics  . Alcohol use: Yes    Comment: occasional  . Drug use: No    Home Medications Prior to Admission medications   Medication Sig Start Date End Date Taking? Authorizing Provider  HYDROcodone-homatropine (HYCODAN) 5-1.5 MG/5ML syrup Take 5 mLs by mouth every 6 (six) hours as needed for cough. 07/14/19   Arby Barrette, MD  lisinopril (PRINIVIL,ZESTRIL) 10 MG tablet Take 1 tablet (10 mg total) by mouth daily. 02/01/16   Osvaldo Shipper, MD    Allergies    Other  Review of Systems   Review of Systems 10 Systems reviewed and are negative for acute change except as noted in the HPI.  Physical Exam Updated Vital Signs BP (!) 145/107   Pulse 100   Temp 98.3 F (36.8 C) (Oral)   Resp 16   Ht 5\' 7"  (1.702 m)   Wt 99.8 kg   SpO2 98%   BMI 34.46 kg/m  Physical Exam Constitutional:      Appearance: Normal appearance.     Comments: Nontoxic.  No respiratory distress.  HENT:     Nose: Nose normal.     Mouth/Throat:     Mouth: Mucous membranes are moist.     Pharynx: Oropharynx is clear.  Eyes:     Extraocular Movements: Extraocular movements intact.     Conjunctiva/sclera: Conjunctivae normal.     Pupils: Pupils are equal, round, and reactive to light.  Cardiovascular:     Rate and Rhythm: Normal rate and regular rhythm.  Pulmonary:     Effort: Pulmonary effort is normal.     Breath sounds: Normal breath sounds.  Abdominal:     General: There is no distension.     Palpations: Abdomen is soft.     Tenderness: There is no abdominal tenderness. There is no guarding.  Musculoskeletal:        General: No swelling or tenderness. Normal  range of motion.     Right lower leg: No edema.     Left lower leg: No edema.  Skin:    General: Skin is warm and dry.  Neurological:     General: No focal deficit present.     Mental Status: He is alert and oriented to person, place, and time.     Coordination: Coordination normal.  Psychiatric:        Mood and Affect: Mood normal.     ED Results / Procedures / Treatments   Labs (all labs ordered are listed, but only abnormal results are displayed) Labs Reviewed  SARS CORONAVIRUS 2 AG (30 MIN TAT) - Abnormal; Notable for the following components:      Result Value   SARS Coronavirus 2 Ag POSITIVE (*)    All other components within normal limits  COMPREHENSIVE METABOLIC PANEL - Abnormal; Notable for the following components:   Sodium 134 (*)    Glucose, Bld 312 (*)    ALT 67 (*)    All other components within normal limits  CBC WITH DIFFERENTIAL/PLATELET - Abnormal; Notable for the following components:   RBC 6.10 (*)    Hemoglobin 18.6 (*)    HCT 53.5 (*)    All other components within normal limits  URINALYSIS, ROUTINE W REFLEX MICROSCOPIC - Abnormal; Notable for the following components:   Glucose, UA >=500 (*)    All other components within normal limits  URINALYSIS, MICROSCOPIC (REFLEX) - Abnormal; Notable for the following components:   Bacteria, UA RARE (*)    All other components within normal limits    EKG None  Radiology DG Chest Port 1 View  Result Date: 07/14/2019 CLINICAL DATA:  Cough EXAM: PORTABLE CHEST 1 VIEW COMPARISON:  01/27/2016 FINDINGS: Heart and mediastinal contours are within normal limits. No focal opacities or effusions. No acute bony abnormality. IMPRESSION: No active disease. Electronically Signed   By: Charlett Nose M.D.   On: 07/14/2019 19:37    Procedures Procedures (including critical care time)  Medications Ordered in ED Medications  guaiFENesin-dextromethorphan (ROBITUSSIN DM) 100-10 MG/5ML syrup 5 mL (has no administration in  time range)  albuterol (VENTOLIN HFA) 108 (90 Base) MCG/ACT inhaler 4 puff (4 puffs Inhalation Given 07/14/19 1851)  acetaminophen (TYLENOL) tablet 1,000 mg (1,000 mg Oral Given 07/14/19 1902)    ED Course  I have reviewed the triage vital signs and the nursing notes.  Pertinent labs & imaging results that were available during my care of the patient were reviewed by me  and considered in my medical decision making (see chart for details).    MDM Rules/Calculators/A&P                     Patient developed symptoms of fever, chills cough and body ache over the past 3 days.  He does test positive for Covid.  Chest x-ray is clear.  Patient has no hypoxia.  No shortness of breath.  Patient does not have vomiting.  He reports he is compliant with his Metformin.  At this time stable for discharge.  Return precautions reviewed. Final Clinical Impression(s) / ED Diagnoses Final diagnoses:  [id number]    Rx / DC Orders ED Discharge Orders         Ordered    HYDROcodone-homatropine (HYCODAN) 5-1.5 MG/5ML syrup  Every 6 hours PRN     07/14/19 2044           Charlesetta Shanks, MD 07/14/19 2342

## 2019-07-14 NOTE — Discharge Instructions (Addendum)
1.  Take extra strength Tylenol every 6 hours to control fever and body aches.  Use the albuterol inhaler given to you in the emergency department every 2-4 hours for coughing and wheezing.  You were given a prescription for cough syrup containing hydrocodone.  This may make you drowsy.  Do not drive or do other activities while using it. 2.  Isolate yourself to prevent spread of illness to others. 3.  Schedule follow-up with your family doctor.  If you do not have a family doctor use the referral number in your discharge instructions to find 1. 4.  Return to the emergency department if you develop difficulty breathing, vomiting and weakness or other concerning symptoms.

## 2019-07-14 NOTE — ED Triage Notes (Signed)
Fever, chills, body aches, headache and cough x 2 days.

## 2019-07-15 ENCOUNTER — Other Ambulatory Visit: Payer: Self-pay | Admitting: Adult Health

## 2019-07-15 MED ORDER — SODIUM CHLORIDE 0.9 % IV SOLN
Freq: Once | INTRAVENOUS | Status: DC
  Filled 2019-07-15: qty 20

## 2019-07-15 NOTE — Progress Notes (Signed)
  I connected by phone with Marchelle Folks Prazak on 07/15/2019 at 9:39 AM to discuss the potential use of an new treatment for mild to moderate COVID-19 viral infection in non-hospitalized patients.  This patient is a 34 y.o. male that meets the FDA criteria for Emergency Use Authorization of bamlanivimab/etesevimab or casirivimab/imdevimab.  Has a (+) direct SARS-CoV-2 viral test result  Has mild or moderate COVID-19   Is ? 34 years of age and weighs ? 40 kg  Is NOT hospitalized due to COVID-19  Is NOT requiring oxygen therapy or requiring an increase in baseline oxygen flow rate due to COVID-19  Is within 10 days of symptom onset  Has at least one of the high risk factor(s) for progression to severe COVID-19 and/or hospitalization as defined in EUA.  Specific high risk criteria : Diabetes   I have spoken and communicated the following to the patient or parent/caregiver:  1. FDA has authorized the emergency use of bamlanivimab/etesevimab and casirivimab\imdevimab for the treatment of mild to moderate COVID-19 in adults and pediatric patients with positive results of direct SARS-CoV-2 viral testing who are 56 years of age and older weighing at least 40 kg, and who are at high risk for progressing to severe COVID-19 and/or hospitalization.  2. The significant known and potential risks and benefits of bamlanivimab/etesevimab and casirivimab\imdevimab, and the extent to which such potential risks and benefits are unknown.  3. Information on available alternative treatments and the risks and benefits of those alternatives, including clinical trials.  4. Patients treated with bamlanivimab/etesevimab and casirivimab\imdevimab should continue to self-isolate and use infection control measures (e.g., wear mask, isolate, social distance, avoid sharing personal items, clean and disinfect "high touch" surfaces, and frequent handwashing) according to CDC guidelines.   5. The patient or parent/caregiver  has the option to accept or refuse bamlanivimab/etesevimab or casirivimab\imdevimab .  After reviewing this information with the patient, The patient agreed to proceed with receiving the bamlanimivab infusion and will be provided a copy of the Fact sheet prior to receiving the infusion.  Scheduled 07/16/19 at 1030  . Felicita Nuncio 07/15/2019 9:39 AM

## 2019-07-16 ENCOUNTER — Ambulatory Visit (HOSPITAL_COMMUNITY)
Admission: RE | Admit: 2019-07-16 | Discharge: 2019-07-16 | Disposition: A | Discharge status: Home / Self Care | Payer: Managed Care, Other (non HMO) | Source: Ambulatory Visit | Attending: Pulmonary Disease | Admitting: Pulmonary Disease

## 2019-07-16 NOTE — Progress Notes (Signed)
Pt scheduled for 1030 Bam infusion; pt hasn't arrived.  Called to verify if coming in and pt states not based on how he's feeling r/t body aches.  Non-emergent. Advised to seek care if status worsens or unable to breathe.   Devra Dopp, MSN, RN

## 2019-12-29 ENCOUNTER — Encounter (HOSPITAL_BASED_OUTPATIENT_CLINIC_OR_DEPARTMENT_OTHER): Payer: Self-pay | Admitting: Emergency Medicine

## 2019-12-29 ENCOUNTER — Other Ambulatory Visit: Payer: Self-pay

## 2019-12-29 DIAGNOSIS — J45909 Unspecified asthma, uncomplicated: Secondary | ICD-10-CM | POA: Insufficient documentation

## 2019-12-29 DIAGNOSIS — E119 Type 2 diabetes mellitus without complications: Secondary | ICD-10-CM | POA: Insufficient documentation

## 2019-12-29 DIAGNOSIS — I10 Essential (primary) hypertension: Secondary | ICD-10-CM | POA: Insufficient documentation

## 2019-12-29 DIAGNOSIS — Z87891 Personal history of nicotine dependence: Secondary | ICD-10-CM | POA: Insufficient documentation

## 2019-12-29 DIAGNOSIS — R202 Paresthesia of skin: Secondary | ICD-10-CM | POA: Insufficient documentation

## 2019-12-29 DIAGNOSIS — Z79899 Other long term (current) drug therapy: Secondary | ICD-10-CM | POA: Insufficient documentation

## 2019-12-29 LAB — CBG MONITORING, ED: Glucose-Capillary: 212 mg/dL — ABNORMAL HIGH (ref 70–99)

## 2019-12-29 NOTE — ED Triage Notes (Signed)
Patient arrived via POV c/o numbness in hands intermittently with elevated blood sugar. Patient CBG 212. Patient is AO x 4, VS WDL, normal gait.

## 2019-12-30 ENCOUNTER — Emergency Department (HOSPITAL_BASED_OUTPATIENT_CLINIC_OR_DEPARTMENT_OTHER)
Admission: EM | Admit: 2019-12-30 | Discharge: 2019-12-30 | Disposition: A | Discharge status: Home / Self Care | Payer: Managed Care, Other (non HMO) | Attending: Emergency Medicine | Admitting: Emergency Medicine

## 2019-12-30 DIAGNOSIS — I10 Essential (primary) hypertension: Secondary | ICD-10-CM

## 2019-12-30 DIAGNOSIS — R202 Paresthesia of skin: Secondary | ICD-10-CM

## 2019-12-30 NOTE — ED Notes (Signed)
Pt states was at work and noticed hands and feet were numb. He attempted to get his sugar checked and was unable to.

## 2019-12-30 NOTE — ED Notes (Signed)
Pt left before getting discharge paperwork. No scripts provided.

## 2019-12-30 NOTE — ED Provider Notes (Signed)
[facility] EMERGENCY DEPARTMENT Provider Note   CSN: [id number] Arrival date & time: 12/29/19  2343   History Chief Complaint  Patient presents with  . Numbness    hands    Caleb Sharp is a 34 y.o. male.  The history is provided by the patient.  He has history of hypertension, diabetes and comes in because of numbness in his hands and feet.  This occurred at work at about 10 PM.  His work does involve repetitive motion but not exposure to vibration.  His hands still feel numb but are improving.  His feet have returned to normal.  He was worried that his blood sugar was either very high or very low.  He has had this happen in the past.  He denies any pain.  Past Medical History:  Diagnosis Date  . ADHD   . Anginal pain (HCC)   . Asthma    sports induced asthma  . Bronchitis   . Foley catheter in place   . Gross hematuria   . Hypertension   . Type 2 diabetes mellitus (HCC)   . Urinary retention     Patient Active Problem List   Diagnosis Date Noted  . Urethral stricture 02/26/2016  . Complicated urinary tract infection 01/27/2016  . Diabetes mellitus type 2 in obese (HCC) 01/27/2016  . Septic shock (HCC) 01/26/2016  . 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 CYSTOSCOPY N/A 12/31/2015   Procedure: CYSTOSCOPY, URETEROSCOPY, SUPRAPUBIC TUBE PLACEMENT;  Surgeon: Bjorn Pippin, MD;  Location: [facility];  Service: Urology;  Laterality: N/A;  . CYSTOSCOPY WITH FULGERATION N/A 06/19/2015   Procedure: CYSTOSCOPY WITH FULGERATION;  Surgeon: Barron Alvine, MD;  Location: [facility];  Service: Urology;  Laterality: N/A;  . IR GENERIC HISTORICAL  01/30/2016   IR CATHETER TUBE CHANGE 01/30/2016 [facility]  . NO PAST SURGERIES    . URETHROPLASTY N/A 02/26/2016   Procedure: URETHROPLASTY WITH  AUTOLOGOUS BUCCAL MUCOSAL GRAFT;  Surgeon: Crist Fat, MD;  Location: [facility];  Service: Urology;   Laterality: N/A;  DR. MACDIARMID TO ASSIST       No family history on file.  Social History   Tobacco Use  . Smoking status: Former Smoker    Packs/day: 1.00    Years: 3.00    Pack years: 3.00    Types: Cigarettes    Quit date: 12/31/2014    Years since quitting: 5.0  . Smokeless tobacco: Never Used  Vaping Use  . Vaping Use: Never used  Substance Use Topics  . Alcohol use: Yes    Comment: occasional  . Drug use: No    Home Medications Prior to Admission medications   Medication Sig Start Date End Date Taking? Authorizing Provider  HYDROcodone-homatropine (HYCODAN) 5-1.5 MG/5ML syrup Take 5 mLs by mouth every 6 (six) hours as needed for cough. 07/14/19   Arby Barrette, MD  lisinopril (PRINIVIL,ZESTRIL) 10 MG tablet Take 1 tablet (10 mg total) by mouth daily. 02/01/16   Osvaldo Shipper, MD    Allergies    Other  Review of Systems   Review of Systems  All other systems reviewed and are negative.   Physical Exam Updated Vital Signs BP (!) 150/107 (BP Location: Left Arm)   Pulse 86   Temp 98.2 F (36.8 C) (Oral)   Resp 17   Ht 5\' 7"  (1.702 m)   Wt 89.8 kg   SpO2 99%  BMI 31.01 kg/m   Physical Exam Vitals and nursing note reviewed.   34 year old male, resting comfortably and in no acute distress. Vital signs are significant for elevated blood pressure. Oxygen saturation is 99%, which is normal. Head is normocephalic and atraumatic. PERRLA, EOMI. Oropharynx is clear. Neck is nontender and supple without adenopathy or JVD. Back is nontender and there is no CVA tenderness. Lungs are clear without rales, wheezes, or rhonchi. Chest is nontender. Heart has regular rate and rhythm without murmur. Abdomen is soft, flat, nontender without masses or hepatosplenomegaly and peristalsis is normoactive. Extremities have no cyanosis or edema, full range of motion is present. Skin is warm and dry without rash. Neurologic: Mental status is normal, cranial nerves are  intact, there are no motor or sensory deficits.  No evidence of peripheral neuropathy.  Pincer grasp is normal.  ED Results / Procedures / Treatments   Labs (all labs ordered are listed, but only abnormal results are displayed) Labs Reviewed  CBG MONITORING, ED - Abnormal; Notable for the following components:      Result Value   Glucose-Capillary 212 (*)    All other components within normal limits    EKG None  Radiology No results found.  Procedures Procedures (including critical care time)  Medications Ordered in ED Medications - No data to display  ED Course  I have reviewed the triage vital signs and the nursing notes.  Pertinent lab results that were available during my care of the patient were reviewed by me and considered in my medical decision making (see chart for details).  MDM Rules/Calculators/A&P Paresthesias without any objective findings of any neurologic issue.  Specifically, no evidence of peripheral neuropathy, no physical findings to suggest carpal tunnel syndrome.  I have explained this to the patient.  He is referred back to his primary care provider.  If symptoms persist, may benefit from evaluation by neurology.  Old records are reviewed, and he has no relevant past visits.  Final Clinical Impression(s) / ED Diagnoses Final diagnoses:  Paresthesia  Elevated blood pressure reading with diagnosis of hypertension    Rx / DC Orders ED Discharge Orders    None       Dione Booze, MD 12/30/19 0200

## 2020-01-31 ENCOUNTER — Encounter (HOSPITAL_BASED_OUTPATIENT_CLINIC_OR_DEPARTMENT_OTHER): Payer: Self-pay | Admitting: *Deleted

## 2020-01-31 ENCOUNTER — Emergency Department (HOSPITAL_BASED_OUTPATIENT_CLINIC_OR_DEPARTMENT_OTHER)
Admission: EM | Admit: 2020-01-31 | Discharge: 2020-01-31 | Disposition: A | Discharge status: Home / Self Care | Payer: Managed Care, Other (non HMO) | Attending: Emergency Medicine | Admitting: Emergency Medicine

## 2020-01-31 ENCOUNTER — Other Ambulatory Visit: Payer: Self-pay

## 2020-01-31 ENCOUNTER — Emergency Department (HOSPITAL_BASED_OUTPATIENT_CLINIC_OR_DEPARTMENT_OTHER): Payer: Managed Care, Other (non HMO)

## 2020-01-31 DIAGNOSIS — I10 Essential (primary) hypertension: Secondary | ICD-10-CM | POA: Insufficient documentation

## 2020-01-31 DIAGNOSIS — R739 Hyperglycemia, unspecified: Secondary | ICD-10-CM

## 2020-01-31 DIAGNOSIS — Z87891 Personal history of nicotine dependence: Secondary | ICD-10-CM | POA: Insufficient documentation

## 2020-01-31 DIAGNOSIS — J45909 Unspecified asthma, uncomplicated: Secondary | ICD-10-CM | POA: Diagnosis not present

## 2020-01-31 DIAGNOSIS — E1165 Type 2 diabetes mellitus with hyperglycemia: Secondary | ICD-10-CM | POA: Insufficient documentation

## 2020-01-31 DIAGNOSIS — R0789 Other chest pain: Secondary | ICD-10-CM | POA: Diagnosis not present

## 2020-01-31 DIAGNOSIS — Z79899 Other long term (current) drug therapy: Secondary | ICD-10-CM | POA: Insufficient documentation

## 2020-01-31 LAB — COMPREHENSIVE METABOLIC PANEL
ALT: 38 U/L (ref 0–44)
AST: 30 U/L (ref 15–41)
Albumin: 3.9 g/dL (ref 3.5–5.0)
Alkaline Phosphatase: 66 U/L (ref 38–126)
Anion gap: 10 (ref 5–15)
BUN: 12 mg/dL (ref 6–20)
CO2: 24 mmol/L (ref 22–32)
Calcium: 8.7 mg/dL — ABNORMAL LOW (ref 8.9–10.3)
Chloride: 98 mmol/L (ref 98–111)
Creatinine, Ser: 0.98 mg/dL (ref 0.61–1.24)
GFR, Estimated: >60 mL/min (ref >60)
Glucose, Bld: 367 mg/dL — ABNORMAL HIGH (ref 70–99)
Potassium: 3.8 mmol/L (ref 3.5–5.1)
Sodium: 132 mmol/L — ABNORMAL LOW (ref 135–145)
Total Bilirubin: 0.4 mg/dL (ref 0.3–1.2)
Total Protein: 7.3 g/dL (ref 6.5–8.1)

## 2020-01-31 LAB — CBC WITH DIFFERENTIAL/PLATELET
Abs Immature Granulocytes: 0.02 10*3/uL (ref 0.00–0.07)
Basophils Absolute: 0 10*3/uL (ref 0.0–0.1)
Basophils Relative: 0 %
Eosinophils Absolute: 0.1 10*3/uL (ref 0.0–0.5)
Eosinophils Relative: 1 %
HCT: 48.3 % (ref 39.0–52.0)
Hemoglobin: 16.6 g/dL (ref 13.0–17.0)
Immature Granulocytes: 0 %
Lymphocytes Relative: 26 %
Lymphs Abs: 1.5 10*3/uL (ref 0.7–4.0)
MCH: 30.7 pg (ref 26.0–34.0)
MCHC: 34.4 g/dL (ref 30.0–36.0)
MCV: 89.3 fL (ref 80.0–100.0)
Monocytes Absolute: 0.7 10*3/uL (ref 0.1–1.0)
Monocytes Relative: 13 %
Neutro Abs: 3.4 10*3/uL (ref 1.7–7.7)
Neutrophils Relative %: 60 %
Platelets: 253 10*3/uL (ref 150–400)
RBC: 5.41 MIL/uL (ref 4.22–5.81)
RDW: 11.9 % (ref 11.5–15.5)
WBC: 5.6 10*3/uL (ref 4.0–10.5)
nRBC: 0 % (ref 0.0–0.2)

## 2020-01-31 LAB — TROPONIN I (HIGH SENSITIVITY)
Troponin I (High Sensitivity): 4 ng/L (ref <18)
Troponin I (High Sensitivity): 3 ng/L (ref <18)

## 2020-01-31 MED ORDER — SUCRALFATE 1 G PO TABS
1.0000 g | ORAL_TABLET | Freq: Three times a day (TID) | ORAL | 0 refills | Status: AC
Start: 2020-01-31 — End: 2020-02-14

## 2020-01-31 NOTE — ED Provider Notes (Signed)
[facility] EMERGENCY DEPARTMENT Provider Note   CSN: [id number] Arrival date & time: 01/31/20  1920     History Chief Complaint  Patient presents with  . Chest Pain    Caleb Sharp is a 34 y.o. male with a past medical history of hypertension, DM 2, who presents today for evaluation of chest pain.  He states that he woke up today at 6 PM and had pain in the left side of his chest.  Since it started it has been improving.  He denies any cough or shortness of breath.  No fevers.  No nausea vomiting or diarrhea.  He denies any leg swelling.  No hemoptysis.  He denies any known sick contacts.  No specific treatment tried.  No aggravating or alleviating factors noted.  No personal history of cancer, PE/DVT.  No family history of cardiac events before the age of 99.  No personal cardiac history.  No tobacco abuse.   HPI     Past Medical History:  Diagnosis Date  . ADHD   . Anginal pain (HCC)   . Asthma    sports induced asthma  . Bronchitis   . Foley catheter in place   . Gross hematuria   . Hypertension   . Type 2 diabetes mellitus (HCC)   . Urinary retention     Patient Active Problem List   Diagnosis Date Noted  . Urethral stricture 02/26/2016  . Complicated urinary tract infection 01/27/2016  . Diabetes mellitus type 2 in obese (HCC) 01/27/2016  . Septic shock (HCC) 01/26/2016  . 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 CYSTOSCOPY N/A 12/31/2015   Procedure: CYSTOSCOPY, URETEROSCOPY, SUPRAPUBIC TUBE PLACEMENT;  Surgeon: Bjorn Pippin, MD;  Location: [facility];  Service: Urology;  Laterality: N/A;  . CYSTOSCOPY WITH FULGERATION N/A 06/19/2015   Procedure: CYSTOSCOPY WITH FULGERATION;  Surgeon: Barron Alvine, MD;  Location: [facility];  Service: Urology;  Laterality: N/A;  . IR GENERIC HISTORICAL  01/30/2016   IR CATHETER TUBE CHANGE 01/30/2016 [facility]  . NO PAST SURGERIES    .  URETHROPLASTY N/A 02/26/2016   Procedure: URETHROPLASTY WITH  AUTOLOGOUS BUCCAL MUCOSAL GRAFT;  Surgeon: Crist Fat, MD;  Location: [facility];  Service: Urology;  Laterality: N/A;  DR. MACDIARMID TO ASSIST       No family history on file.  Social History   Tobacco Use  . Smoking status: Former Smoker    Packs/day: 1.00    Years: 3.00    Pack years: 3.00    Types: Cigarettes    Quit date: 12/31/2014    Years since quitting: 5.0  . Smokeless tobacco: Never Used  Vaping Use  . Vaping Use: Never used  Substance Use Topics  . Alcohol use: Yes    Comment: occasional  . Drug use: No    Home Medications Prior to Admission medications   Medication Sig Start Date End Date Taking? Authorizing Provider  lisinopril (PRINIVIL,ZESTRIL) 10 MG tablet Take 1 tablet (10 mg total) by mouth daily. 02/01/16   Osvaldo Shipper, MD  sucralfate (CARAFATE) 1 g tablet Take 1 tablet (1 g total) by mouth 4 (four) times daily -  with meals and at bedtime for 14 days. 01/31/20 02/14/20  Cristina Gong, PA-C    Allergies    Other  Review of Systems   Review of Systems  Constitutional: Negative for chills and fever.  HENT: Negative for congestion.  Respiratory: Negative for cough and shortness of breath.   Cardiovascular: Positive for chest pain. Negative for palpitations and leg swelling.  Gastrointestinal: Negative for abdominal pain, diarrhea, nausea and vomiting.  Musculoskeletal: Negative for back pain and neck pain.  Neurological: Negative for weakness and headaches.  All other systems reviewed and are negative.   Physical Exam Updated Vital Signs BP (!) 137/97   Pulse 99   Temp 99 F (37.2 C) (Oral)   Resp (!) 22   Ht 5\' 6"  (1.676 m)   Wt 97.5 kg   SpO2 100%   BMI 34.70 kg/m   Physical Exam Vitals and nursing note reviewed.  Constitutional:      General: He is not in acute distress.    Appearance: He is not diaphoretic.  HENT:     Head: Normocephalic and atraumatic.   Eyes:     Conjunctiva/sclera: Conjunctivae normal.  Neck:     Vascular: No JVD.     Trachea: No tracheal deviation.  Cardiovascular:     Rate and Rhythm: Normal rate and regular rhythm.     Pulses:          Radial pulses are 2+ on the right side and 2+ on the left side.       Dorsalis pedis pulses are 2+ on the right side and 2+ on the left side.       Posterior tibial pulses are 2+ on the right side and 2+ on the left side.     Heart sounds: Normal heart sounds. No murmur heard.   Pulmonary:     Effort: Pulmonary effort is normal. No respiratory distress.     Breath sounds: Normal breath sounds. No decreased breath sounds or wheezing.  Chest:     Chest wall: No deformity, tenderness or crepitus.  Abdominal:     Palpations: Abdomen is soft.     Tenderness: There is no abdominal tenderness. There is no guarding or rebound.  Musculoskeletal:     Cervical back: Neck supple.     Right lower leg: No tenderness. No edema.     Left lower leg: No tenderness. No edema.  Skin:    General: Skin is warm and dry.     Comments: No skin changes visualized over the area of pain.   Neurological:     General: No focal deficit present.     Mental Status: He is alert.  Psychiatric:        Mood and Affect: Mood normal.        Behavior: Behavior normal.     ED Results / Procedures / Treatments   Labs (all labs ordered are listed, but only abnormal results are displayed) Labs Reviewed  COMPREHENSIVE METABOLIC PANEL - Abnormal; Notable for the following components:      Result Value   Sodium 132 (*)    Glucose, Bld 367 (*)    Calcium 8.7 (*)    All other components within normal limits  CBC WITH DIFFERENTIAL/PLATELET  TROPONIN I (HIGH SENSITIVITY)  TROPONIN I (HIGH SENSITIVITY)    EKG None  Radiology DG Chest 2 View  Result Date: 01/31/2020 CLINICAL DATA:  Left chest pain for 1 day EXAM: CHEST - 2 VIEW COMPARISON:  Radiograph 07/14/2019 FINDINGS: No consolidation, features of  edema, pneumothorax, or effusion. Pulmonary vascularity is normally distributed. The cardiomediastinal contours are unremarkable. No acute osseous or soft tissue abnormality. IMPRESSION: No acute cardiopulmonary abnormality. Electronically Signed   By: 07/16/2019 M.D.   On:  01/31/2020 19:44    Procedures Procedures (including critical care time)  Medications Ordered in ED Medications - No data to display  ED Course  I have reviewed the triage vital signs and the nursing notes.  Pertinent labs & imaging results that were available during my care of the patient were reviewed by me and considered in my medical decision making (see chart for details).    MDM Rules/Calculators/A&P                         Patient is to be discharged with recommendation to follow up with PCP in regards to today's hospital visit. Chest pain is not likely of cardiac or pulmonary etiology d/t presentation, PERC negative, VSS, no tracheal deviation, no JVD or new murmur, RRR, breath sounds equal bilaterally, EKG without acute abnormalities, negative troponinx2, and negative CXR. Pt has been advised to return to the ED if CP becomes exertional, associated with diaphoresis or nausea, radiates to left jaw/arm, worsens or becomes concerning in any way.   I did discuss with patient that his blood sugar is significantly elevated today.  He states that he is setting up an appointment with his primary care doctor.  He states that he used to be on a medicine however he does not know the name of it.  He did not tolerate Metformin and does not wish for a Metformin prescription.  As he does not know the name or dose of his previous medication that he felt worked for him we discussed options for trial of a possibly different medication versus outpatient follow-up.  He wishes to follow-up with his PCP.  He does not appear to have significant acidosis, no evidence of DKA.  I discussed the importance of dietary compliance and  exercise.  Given that his pain has improved I am reassured.  Recommended bland diet, prilosec and carafate trial.   Return precautions were discussed with patient who states their understanding.  At the time of discharge patient denied any unaddressed complaints or concerns.  Patient is agreeable for discharge home.  Note: Portions of this report may have been transcribed using voice recognition software. Every effort was made to ensure accuracy; however, inadvertent computerized transcription errors may be present   Final Clinical Impression(s) / ED Diagnoses Final diagnoses:  Atypical chest pain  Hyperglycemia    Rx / DC Orders ED Discharge Orders         Ordered    sucralfate (CARAFATE) 1 g tablet  3 times daily with meals & bedtime        01/31/20 2340           Cristina Gong, [place] 01/31/20 2348    Charlynne Pander, MD 02/01/20 1558

## 2020-01-31 NOTE — Discharge Instructions (Signed)
Today your chest x-ray and heart related tests were reassuring.  Your blood sugar was high today.  It is important that you take medicine so that your sugar will be lowered.  Additionally I would recommend a low sugar, low-carb diet and getting exercise.  If you develop fevers, shortness of breath, any changes in your chest pain, feel like you cannot pass out, or have any other concerns please seek additional medical care and evaluation.  Please get the medicine Prilosec.  This is available over-the-counter.  Please take this first thing in the morning as soon as you wake up before you get hungry.  This will help reduce any stomach acid. Additionally I have given you a prescription for Carafate.  You can take this as directed and it will help coat your stomach.

## 2020-01-31 NOTE — ED Notes (Signed)
Patient transported to X-ray 

## 2020-01-31 NOTE — ED Triage Notes (Signed)
C/o left side chest pain x 1 day , denies SOB or n/v

## 2020-05-15 ENCOUNTER — Other Ambulatory Visit: Payer: Self-pay

## 2020-05-16 ENCOUNTER — Encounter: Payer: Self-pay | Admitting: Family Medicine

## 2020-05-16 DIAGNOSIS — Z6830 Body mass index (BMI) 30.0-30.9, adult: Secondary | ICD-10-CM | POA: Insufficient documentation

## 2020-05-16 DIAGNOSIS — Z87891 Personal history of nicotine dependence: Secondary | ICD-10-CM | POA: Insufficient documentation

## 2020-05-16 DIAGNOSIS — R7402 Elevation of levels of lactic acid dehydrogenase (LDH): Secondary | ICD-10-CM | POA: Insufficient documentation

## 2020-05-16 DIAGNOSIS — R7401 Elevation of levels of liver transaminase levels: Secondary | ICD-10-CM | POA: Insufficient documentation

## 2020-05-16 DIAGNOSIS — E782 Mixed hyperlipidemia: Secondary | ICD-10-CM | POA: Insufficient documentation

## 2020-05-16 DIAGNOSIS — I10 Essential (primary) hypertension: Secondary | ICD-10-CM | POA: Insufficient documentation

## 2020-05-16 DIAGNOSIS — E559 Vitamin D deficiency, unspecified: Secondary | ICD-10-CM | POA: Insufficient documentation

## 2020-05-23 NOTE — Progress Notes (Signed)
This encounter was created in error - please disregard.

## 2020-05-29 ENCOUNTER — Ambulatory Visit: Payer: Managed Care, Other (non HMO) | Admitting: Family Medicine

## 2020-11-11 ENCOUNTER — Emergency Department (HOSPITAL_BASED_OUTPATIENT_CLINIC_OR_DEPARTMENT_OTHER)
Admission: EM | Admit: 2020-11-11 | Discharge: 2020-11-11 | Disposition: A | Discharge status: Home / Self Care | Payer: BC Managed Care – PPO | Attending: Emergency Medicine | Admitting: Emergency Medicine

## 2020-11-11 ENCOUNTER — Other Ambulatory Visit: Payer: Self-pay

## 2020-11-11 ENCOUNTER — Encounter (HOSPITAL_BASED_OUTPATIENT_CLINIC_OR_DEPARTMENT_OTHER): Payer: Self-pay

## 2020-11-11 DIAGNOSIS — R109 Unspecified abdominal pain: Secondary | ICD-10-CM | POA: Insufficient documentation

## 2020-11-11 DIAGNOSIS — Z5321 Procedure and treatment not carried out due to patient leaving prior to being seen by health care provider: Secondary | ICD-10-CM | POA: Diagnosis not present

## 2020-11-11 LAB — CBG MONITORING, ED: Glucose-Capillary: 146 mg/dL — ABNORMAL HIGH (ref 70–99)

## 2020-11-11 NOTE — ED Triage Notes (Addendum)
Pt states he mistakenly took metformin 1000mg  at 10pm on 9/8 and 1000mg  at 9am on 9/11-states his rx is for 1000mg  twice daily that he takes at 3am and 7pm-last BS 145 at 345pm-concerned that he took the 2 doses too close together-c/o abd pain, nausea-NAD-steady gait

## 2020-11-11 NOTE — ED Notes (Signed)
Discussed pt's case with EDP-no orders received

## 2020-12-04 ENCOUNTER — Ambulatory Visit: Payer: Self-pay | Admitting: Family Medicine

## 2021-04-17 ENCOUNTER — Other Ambulatory Visit: Payer: Self-pay

## 2021-04-17 ENCOUNTER — Emergency Department (HOSPITAL_BASED_OUTPATIENT_CLINIC_OR_DEPARTMENT_OTHER)
Admission: EM | Admit: 2021-04-17 | Discharge: 2021-04-17 | Disposition: A | Discharge status: Home / Self Care | Payer: BC Managed Care – PPO | Attending: Emergency Medicine | Admitting: Emergency Medicine

## 2021-04-17 ENCOUNTER — Encounter (HOSPITAL_BASED_OUTPATIENT_CLINIC_OR_DEPARTMENT_OTHER): Payer: Self-pay

## 2021-04-17 DIAGNOSIS — I10 Essential (primary) hypertension: Secondary | ICD-10-CM | POA: Insufficient documentation

## 2021-04-17 DIAGNOSIS — Z79899 Other long term (current) drug therapy: Secondary | ICD-10-CM | POA: Insufficient documentation

## 2021-04-17 DIAGNOSIS — E119 Type 2 diabetes mellitus without complications: Secondary | ICD-10-CM | POA: Insufficient documentation

## 2021-04-17 DIAGNOSIS — L42 Pityriasis rosea: Secondary | ICD-10-CM | POA: Insufficient documentation

## 2021-04-17 NOTE — ED Notes (Signed)
Pt NAD, a/ox4, states he has had an itchy rash to trunk x 2 weeks. Does not appear on palms or soles

## 2021-04-17 NOTE — ED Provider Notes (Signed)
[facility] EMERGENCY DEPARTMENT Provider Note   CSN: [id number] Arrival date & time: 04/17/21  1505     History  Chief Complaint  Patient presents with   Rash    Snowden Fafard Linsley is a 36 y.o. male.  The history is provided by the patient. No language interpreter was used.  Rash  36 year old male significant history of diabetes, hypertension, obesity, presented for evaluation of a rash.  Patient report for the past 2 weeks he noticed a itchy rash that appears throughout his body.  It for started somewhere in his chest and back and has spread throughout.  He did try to use some A&D ointment but noticed no improvement.  He does not complain of any headache, fever, neck stiffness, vision changes, runny nose sneezing or coughing no nausea vomiting diarrhea no chest pain or shortness of breath and no joint pain.  No one else at home with similar rash.  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  He also denies having any change in soap, detergent, new pets, new medication, or any other environmental changes.  Home Medications Prior to Admission medications   Medication Sig Start Date End Date Taking? Authorizing Provider  lisinopril (PRINIVIL,ZESTRIL) 10 MG tablet Take 1 tablet (10 mg total) by mouth daily. 02/01/16   Bonnielee Haff, MD  sucralfate (CARAFATE) 1 g tablet Take 1 tablet (1 g total) by mouth 4 (four) times daily -  with meals and at bedtime for 14 days. 01/31/20 02/14/20  Lorin Glass, PA-C      Allergies    Other    Review of Systems   Review of Systems  Skin:  Positive for rash.  All other systems reviewed and are negative.  Physical Exam Updated Vital Signs BP (!) 140/94 (BP Location: Left Arm)    Pulse 90    Temp 99.1 F (37.3 C) (Oral)    Resp 18    Ht 5\' 7"  (1.702 m)    Wt 89.8 kg    BMI 31.01 kg/m  Physical Exam Vitals and nursing note reviewed.  Constitutional:      General: He is not in acute distress.     Appearance: He is well-developed.  HENT:     Head: Atraumatic.     Mouth/Throat:     Comments: No rash to the mucosal region Eyes:     Conjunctiva/sclera: Conjunctivae normal.  Cardiovascular:     Rate and Rhythm: Normal rate and regular rhythm.     Pulses: Normal pulses.     Heart sounds: Normal heart sounds.  Pulmonary:     Effort: Pulmonary effort is normal.     Breath sounds: Normal breath sounds.  Abdominal:     Palpations: Abdomen is soft.     Tenderness: There is no abdominal tenderness.  Musculoskeletal:     Cervical back: Normal range of motion and neck supple. No rigidity.  Skin:    Findings: Rash (Diffuse scattered oval, papulosquamous lesions on the trunk and proximal areas of the extremities) present.  Neurological:     Mental Status: He is alert.  Psychiatric:        Mood and Affect: Mood normal.    ED Results / Procedures / Treatments   Labs (all labs ordered are listed, but only abnormal results are displayed) Labs Reviewed - No data to display  EKG None  Radiology No results found.  Procedures Procedures    Medications Ordered in ED Medications -  No data to display  ED Course/ Medical Decision Making/ A&P                           Medical Decision Making  BP (!) 140/94 (BP Location: Left Arm)    Pulse 90    Temp 99.1 F (37.3 C) (Oral)    Resp 18    Ht 5\' 7"  (1.702 m)    Wt 89.8 kg    BMI 31.01 kg/m   3:44 PM This is a 36 year old male with significant history of diabetes and hypertension who presents for rash that is ongoing for the past 2 weeks. The rash is described as an papulosquamous lesions on the trunk and proximal areas of the extremities with appearance consistent with pityriasis rosacea.  This is a noncontagious rash, he does not have any other systemic manifestation concerning for condition such as syphilis, meningococcal rash, hives, allergic reaction or other infectious rash.  Reassurance given.  There is a herald patch noted to  his right side of back that is scaly.  Doubt tinea corporis.  Patient denies any new sexual activities I have low suspicion for syphilis.  No history of eczema.  Patient does not experiencing intense pruritus therefore I will try to avoid steroid as patient also has history of diabetes.  Return precaution given          Final Clinical Impression(s) / ED Diagnoses Final diagnoses:  Pityriasis rosea    Rx / DC Orders ED Discharge Orders     None         Domenic Moras, PA-C [id number]    Lianne Cure, DO [id number]

## 2021-04-17 NOTE — Discharge Instructions (Signed)
What is pityriasis rosea?--Pityriasis rosea is a harmless skin rash that causes small, itchy spots on the belly, back, chest, arms, and legs. The rash usually lasts about 4 to 6 weeks, but in some people, it can last for months.  Pityriasis rosea is most common in older children and young adults.  What causes pityriasis rosea?--The cause is not known. But it does not seem to be easily spread from person to person.  What are the symptoms of pityriasis rosea?--In many people, the rash starts with one round or oval patch. This spot is about the size of a half dollar but might be larger. A day or two later, many smaller spots about the size of a dime appear. But not everyone gets the large spot before the rest of the rash.  If you have light skin, the spots are usually pink or salmon-colored. If you have dark skin, the spots can be a red-brown color or darker than your skin   The spots might be:  ?On the belly, back, chest, arms, and legs  ?Spread out in a "fir tree" or "Christmas tree" pattern on the back  ?Itchy  ?A little scaly  In children, the spots sometimes happen on the face and scalp.  Is there a test for pityriasis rosea?--Maybe. Your doctor or nurse will often be able to tell if you have it by learning about your symptoms and doing an exam. But they might gently scrape the rash or do a different test to get a sample of your skin. Tests on the skin sample can help the doctor tell if you have pityriasis rosea or a different disease.  Is there anything I can do on my own to feel better?--Yes. You can:  ?Take a special kind of bath called an oatmeal bath. Use lukewarm, not hot water.  ?Use unscented moisturizing lotion or cream on your skin.  ?Try to keep your body cool.  How is pityriasis rosea treated?--Most people do not need any treatment. If the symptoms bother you, your doctor might prescribe creams or ointments to help with itching. In rare cases, doctors prescribe other medicines or a special  type of treatment that uses lights, called "phototherapy."

## 2021-04-17 NOTE — ED Triage Notes (Signed)
Pt arrives ambulatory to ED with c/o new rash to arms, chest and back X2 weeks. States he figured it would go away but it has not. Unsure of cause, only allergy is to coconut. Pt has been using A&D ointment.
